# Patient Record
Sex: Female | Born: 1985 | Race: White | Hispanic: No | Marital: Married | Smoking: Never smoker
Health system: Southern US, Community
[De-identification: ages and names within clinical notes are randomized; demographics above are authoritative.]

## PROBLEM LIST (undated history)

## (undated) DIAGNOSIS — K589 Irritable bowel syndrome without diarrhea: Secondary | ICD-10-CM

## (undated) HISTORY — PX: OVARIAN CYST REMOVAL: SHX89

## (undated) HISTORY — PX: WISDOM TOOTH EXTRACTION: SHX21

---

## 2007-01-18 ENCOUNTER — Encounter (HOSPITAL_COMMUNITY): Payer: Self-pay | Admitting: Obstetrics and Gynecology

## 2007-01-18 ENCOUNTER — Ambulatory Visit (HOSPITAL_COMMUNITY): Admission: RE | Admit: 2007-01-18 | Discharge: 2007-01-18 | Payer: Self-pay | Admitting: Obstetrics and Gynecology

## 2010-06-22 NOTE — Op Note (Signed)
[name], Kim Pope              ACCOUNT NO.:  [id number]   MEDICAL RECORD NO.:  [id number]          PATIENT TYPE:  AMB   LOCATION:  SDC                           FACILITY:  [facility]   PHYSICIAN:  Zelphia Cairo, MD    DATE OF BIRTH:  04/17/1985   DATE OF PROCEDURE:  01/18/2007  DATE OF DISCHARGE:                               OPERATIVE REPORT   PREOPERATIVE DIAGNOSIS:  Right ovarian cyst, pelvic pain.   POSTOPERATIVE DIAGNOSIS:  Right ovarian cyst, pelvic pain.   PROCEDURE:  Diagnostic laparoscopy with right ovarian cystectomy.   SURGEON:  Zelphia Cairo, MD.   ANESTHESIA:  General, local.   FINDINGS:  Large simple-appearing right ovarian cyst draining clear  fluid otherwise normal appearing pelvis.   SPECIMENS:  Ovarian cyst wall to pathology.   COMPLICATIONS:  None.   CONDITION:  Extubated and stable to recovery room.   DESCRIPTION OF PROCEDURE:  [name] was taken to the operating room  where general anesthesia was found to be adequate.  She was placed in  the dorsal lithotomy position using Allen stirrups. She was prepped and  draped in sterile fashion and a catheter was used to drain her bladder  for approximately 30 mL of clear urine. A speculum was placed in the  vagina and a single-tooth tenaculum on the anterior lip of the cervix.  A Hulka clamp was placed for uterine manipulation.  The single-tooth  tenaculum and speculum were removed.  Our attention was then turned to  the abdomen.   A small infraumbilical skin incision was then made with a scalpel and  this was carried down to the underlying fascia. The fascia was grasped  with two Kocher clamps and incised using the scalpel. An 11-mm trocar  was then inserted through the peritoneum and the abdomen and pelvis were  insufflated.  A small suprapubic incision was then made with the scalpel  and a 5-mm trocar was inserted under direct visualization. The patient  was placed in Trendelenburg position and a survey of  the pelvis was  performed. The left ovary and fallopian tube appeared normal.  The  uterus appeared normal. She was noticed to have a large simple-appearing  right ovarian cyst occupying the cul-de-sac with the right fallopian  tube draped over top.   The right ovarian cyst was grasped with a blunt grasper and an incision  was made over the cyst using sissors. The cyst wall was separated with  sharp and blunt dissection using laparoscopic scissors.  The cyst wall  was ruptured during the course of manipulation and serous fluid was  noted to be draining.  The cyst wall then easily peeled from the right  ovary all the way to the base of the ovarian cyst wall.  At this time, a  definite plane between the ovary and the ovarian cyst wall could not be  detected. The base of the cyst wall was then amputated from the ovary  using the gyrus.  The specimen was removed from the pelvis and sent to  pathology.  The right ovary was then observed and found to be  hemostatic.  The pelvis was copiously irrigated. All trocars and  instruments were then removed from the  abdomen.  The fascia of the infraumbilical incision was reapproximated  with Vicryl.  The skin incisions were reapproximated with 3-0 Vicryl.  The Hulka clamp was removed from the uterus.  The patient was taken to  the recovery room in stable condition.      Zelphia Cairo, MD  Electronically Signed     GA/MEDQ  D:  01/18/2007  T:  01/19/2007  Job:  [id number]

## 2010-11-15 LAB — CBC
HCT: 38.5
MCV: 88.1
Platelets: 272
RDW: 12.3
WBC: 4.9

## 2010-11-15 LAB — PREGNANCY, URINE: Preg Test, Ur: NEGATIVE

## 2011-02-08 HISTORY — PX: BREAST BIOPSY: SHX20

## 2011-10-05 ENCOUNTER — Other Ambulatory Visit: Payer: Self-pay | Admitting: Obstetrics and Gynecology

## 2011-10-05 DIAGNOSIS — N6312 Unspecified lump in the right breast, upper inner quadrant: Secondary | ICD-10-CM

## 2011-10-06 ENCOUNTER — Ambulatory Visit
Admission: RE | Admit: 2011-10-06 | Discharge: 2011-10-06 | Disposition: A | Discharge status: Home / Self Care | Payer: 59 | Source: Ambulatory Visit | Attending: Obstetrics and Gynecology | Admitting: Obstetrics and Gynecology

## 2011-10-06 ENCOUNTER — Other Ambulatory Visit: Payer: Self-pay | Admitting: Obstetrics and Gynecology

## 2011-10-06 DIAGNOSIS — N6312 Unspecified lump in the right breast, upper inner quadrant: Secondary | ICD-10-CM

## 2013-06-13 ENCOUNTER — Encounter: Payer: Self-pay | Admitting: Internal Medicine

## 2013-08-12 ENCOUNTER — Other Ambulatory Visit (INDEPENDENT_AMBULATORY_CARE_PROVIDER_SITE_OTHER): Payer: BC Managed Care – PPO

## 2013-08-12 ENCOUNTER — Ambulatory Visit (INDEPENDENT_AMBULATORY_CARE_PROVIDER_SITE_OTHER): Payer: BC Managed Care – PPO | Admitting: Internal Medicine

## 2013-08-12 ENCOUNTER — Encounter: Payer: Self-pay | Admitting: Internal Medicine

## 2013-08-12 VITALS — BP 96/70 | HR 88 | Ht 66.5 in | Wt 141.4 lb

## 2013-08-12 DIAGNOSIS — R197 Diarrhea, unspecified: Secondary | ICD-10-CM

## 2013-08-12 DIAGNOSIS — G8929 Other chronic pain: Secondary | ICD-10-CM

## 2013-08-12 DIAGNOSIS — R634 Abnormal weight loss: Secondary | ICD-10-CM

## 2013-08-12 DIAGNOSIS — R1084 Generalized abdominal pain: Secondary | ICD-10-CM

## 2013-08-12 DIAGNOSIS — R1013 Epigastric pain: Secondary | ICD-10-CM

## 2013-08-12 DIAGNOSIS — K625 Hemorrhage of anus and rectum: Secondary | ICD-10-CM

## 2013-08-12 LAB — COMPREHENSIVE METABOLIC PANEL
ALT: 19 U/L (ref 0–35)
AST: 19 U/L (ref 0–37)
Albumin: 4.3 g/dL (ref 3.5–5.2)
Alkaline Phosphatase: 41 U/L (ref 39–117)
BUN: 14 mg/dL (ref 6–23)
CALCIUM: 9.3 mg/dL (ref 8.4–10.5)
CHLORIDE: 106 meq/L (ref 96–112)
CO2: 24 mEq/L (ref 19–32)
Creatinine, Ser: 0.8 mg/dL (ref 0.4–1.2)
GFR: 99.37 mL/min (ref >60.00)
Glucose, Bld: 100 mg/dL — ABNORMAL HIGH (ref 70–99)
POTASSIUM: 3.5 meq/L (ref 3.5–5.1)
Sodium: 139 mEq/L (ref 135–145)
Total Bilirubin: 0.4 mg/dL (ref 0.2–1.2)
Total Protein: 7.6 g/dL (ref 6.0–8.3)

## 2013-08-12 LAB — CBC WITH DIFFERENTIAL/PLATELET
BASOS PCT: 0.8 % (ref 0.0–3.0)
Basophils Absolute: 0 10*3/uL (ref 0.0–0.1)
EOS ABS: 0.2 10*3/uL (ref 0.0–0.7)
Eosinophils Relative: 3.7 % (ref 0.0–5.0)
HEMATOCRIT: 38.4 % (ref 36.0–46.0)
HEMOGLOBIN: 12.9 g/dL (ref 12.0–15.0)
LYMPHS ABS: 2.3 10*3/uL (ref 0.7–4.0)
Lymphocytes Relative: 45.1 % (ref 12.0–46.0)
MCHC: 33.6 g/dL (ref 30.0–36.0)
MCV: 87.2 fl (ref 78.0–100.0)
MONO ABS: 0.4 10*3/uL (ref 0.1–1.0)
Monocytes Relative: 8 % (ref 3.0–12.0)
NEUTROS ABS: 2.2 10*3/uL (ref 1.4–7.7)
Neutrophils Relative %: 42.4 % — ABNORMAL LOW (ref 43.0–77.0)
Platelets: 266 10*3/uL (ref 150.0–400.0)
RBC: 4.4 Mil/uL (ref 3.87–5.11)
RDW: 12.5 % (ref 11.5–15.5)
WBC: 5.1 10*3/uL (ref 4.0–10.5)

## 2013-08-12 LAB — TSH: TSH: 0.58 u[IU]/mL (ref 0.35–4.50)

## 2013-08-12 LAB — IGA: IgA: 147 mg/dL (ref 68–378)

## 2013-08-12 NOTE — Patient Instructions (Signed)
You have been scheduled for an abdominal ultrasound at [facility] (1st floor of hospital) on 08/15/2013 at 8:30am. Please arrive 15 minutes prior to your appointment for registration. Make certain not to have anything to eat or drink 6 hours prior to your appointment. Should you need to reschedule your appointment, please contact radiology at [phone]. This test typically takes about 30 minutes to perform.   Your physician has requested that you go to the basement for lab work before leaving today.  You have been scheduled for an endoscopy and colonoscopy. Please follow the written instructions given to you at your visit today. Please pick up your prep at the pharmacy within the next 1-3 days. If you use inhalers (even only as needed), please bring them with you on the day of your procedure. Your physician has requested that you go to www.startemmi.com and enter the access code given to you at your visit today. This web site gives a general overview about your procedure. However, you should still follow specific instructions given to you by our office regarding your preparation for the procedure.

## 2013-08-12 NOTE — Progress Notes (Signed)
HISTORY OF PRESENT ILLNESS:  Kim Pope is a 28 y.o. female  with no significant past medical history who presents today regarding chronic problems with abdominal pain and loose stools. She is accompanied by her mother. Patient reports that her problems started about 2 years ago after multiple family members had "a bug". She had problems with postprandial abdominal cramping and diarrhea. Symptoms seem to settle down for while only to return about 18 months ago. She describes cramping lower abdominal pain with diaphoresis associated with urgency and followed by diarrhea. Defecation does help the discomfort. Food is definitely trigger. She will not go more than one week without symptoms. Symptoms can occur several times per day. She does have constipation much less frequently. She reports for pound weight loss in the past 6 months. Occasionally symptoms that night. She also noticed blood mixed with her stool about 2 months ago. This prompted scheduling a GI appointment. Next, she mentions problems with unpredictable epigastric pain with radiation under the ribs bilaterally. Generally lasts 30 minutes. Thereafter she feels sore. This occurs about once every 2 weeks. She was told this might be acid reflux and was placed on acid reducer which did not help. There is no family history of colon cancer, inflammatory bowel disease, or celiac disease. She works full-time as a Surveyor, [employer] for [employer]. She has never smoked.  REVIEW OF SYSTEMS:  All non-GI ROS negative except for sinus and allergy, headaches, depression, muscle cramps, urinary frequency  History reviewed. No pertinent past medical history.  Past Surgical History  Procedure Laterality Date  . Ovarian cyst removal Right     Social History MARLANE HIRSCHMANN  reports that she has never smoked. She has never used smokeless tobacco. She reports that she drinks alcohol. She reports that she does not use illicit drugs.  family history includes  Diabetes in her maternal grandmother; GER disease in her maternal aunt, maternal grandfather, and mother; Irritable bowel syndrome in her father; Lymphoma in her paternal grandmother.  No Known Allergies     PHYSICAL EXAMINATION: Vital signs: BP 96/70  Pulse 88  Ht 5' 6.5" (1.689 m)  Wt 141 lb 6 oz (64.127 kg)  BMI 22.48 kg/m2  LMP 08/07/2013  Constitutional: generally well-appearing, no acute distress Psychiatric: alert and oriented x3, cooperative Eyes: extraocular movements intact, anicteric, conjunctiva pink Mouth: oral pharynx moist, no lesions Neck: supple no lymphadenopathy Cardiovascular: heart regular rate and rhythm, no murmur Lungs: clear to auscultation bilaterally Abdomen: soft, nontender, nondistended, no obvious ascites, no peritoneal signs, normal bowel sounds, no organomegaly Rectal: Deferred until colonoscopy Extremities: no lower extremity edema bilaterally Skin: no lesions on visible extremities. Tattoo on the lateral side of right foot Neuro: No focal deficits.   ASSESSMENT:  #1. Chronic postprandial abdominal discomfort with urgency and loose stools. Most likely IBS. However, given mild weight loss, episode of bleeding, and occasional nocturnal symptoms, other organic causes should be excluded #2. Separate epigastric pain 3 patient under the ribs bilaterally. Rule out gallbladder disease. Rule out ulcer disease   PLAN:  #1. Laboratories today including CBC, comprehensive metabolic panel, TSH, #2. Tissue transglutaminase antibody and serum IgA to screen for celiac disease #3. Abdominal ultrasound to evaluate upper abdominal pain #4. Upper endoscopy to evaluate abdominal pain and loose stools.The nature of the procedure, as well as the risks, benefits, and alternatives were carefully and thoroughly reviewed with the patient. Ample time for discussion and questions allowed. The patient understood, was satisfied, and agreed to proceed.The nature of  the  procedure, as well as the risks, benefits, and alternatives were carefully and thoroughly reviewed with the patient. Ample time for discussion and questions allowed. The patient understood, was satisfied, and agreed to proceed. #5. Colonoscopy to evaluate abdominal pain, loose stools, and rectal bleeding.The nature of the procedure, as well as the risks, benefits, and alternatives were carefully and thoroughly reviewed with the patient. Ample time for discussion and questions allowed. The patient understood, was satisfied, and agreed to proceed. Suprep provided with instructions. #6. Followup to be determined after the above completed

## 2013-08-13 ENCOUNTER — Encounter: Payer: Self-pay | Admitting: Internal Medicine

## 2013-08-13 LAB — TISSUE TRANSGLUTAMINASE, IGA: TISSUE TRANSGLUTAMINASE AB, IGA: 2.2 U/mL (ref <20)

## 2013-08-15 ENCOUNTER — Ambulatory Visit (HOSPITAL_COMMUNITY): Payer: BC Managed Care – PPO

## 2013-08-20 ENCOUNTER — Ambulatory Visit
Admission: RE | Admit: 2013-08-20 | Discharge: 2013-08-20 | Disposition: A | Discharge status: Home / Self Care | Payer: BC Managed Care – PPO | Source: Ambulatory Visit | Attending: Internal Medicine | Admitting: Internal Medicine

## 2013-08-20 DIAGNOSIS — K625 Hemorrhage of anus and rectum: Secondary | ICD-10-CM

## 2013-08-20 DIAGNOSIS — R1084 Generalized abdominal pain: Secondary | ICD-10-CM

## 2013-08-20 DIAGNOSIS — R634 Abnormal weight loss: Secondary | ICD-10-CM

## 2013-08-20 DIAGNOSIS — R197 Diarrhea, unspecified: Secondary | ICD-10-CM

## 2013-08-27 ENCOUNTER — Ambulatory Visit (AMBULATORY_SURGERY_CENTER): Payer: BC Managed Care – PPO | Admitting: Internal Medicine

## 2013-08-27 ENCOUNTER — Encounter: Payer: Self-pay | Admitting: Internal Medicine

## 2013-08-27 VITALS — BP 97/61 | HR 72 | Temp 98.4°F | Resp 15 | Ht 66.5 in | Wt 141.0 lb

## 2013-08-27 DIAGNOSIS — D1330 Benign neoplasm of unspecified part of small intestine: Secondary | ICD-10-CM

## 2013-08-27 DIAGNOSIS — K625 Hemorrhage of anus and rectum: Secondary | ICD-10-CM

## 2013-08-27 DIAGNOSIS — R1084 Generalized abdominal pain: Secondary | ICD-10-CM

## 2013-08-27 DIAGNOSIS — R197 Diarrhea, unspecified: Secondary | ICD-10-CM

## 2013-08-27 DIAGNOSIS — D126 Benign neoplasm of colon, unspecified: Secondary | ICD-10-CM

## 2013-08-27 MED ORDER — SODIUM CHLORIDE 0.9 % IV SOLN: 500.0000 mL | INTRAVENOUS | Status: DC

## 2013-08-27 MED ORDER — LIBRAX 5-2.5 MG PO CAPS
1.0000 | ORAL_CAPSULE | Freq: Three times a day (TID) | ORAL | Status: DC | PRN
Start: 2013-08-27 — End: 2014-11-19

## 2013-08-27 NOTE — Progress Notes (Signed)
Pt awake with spont resp, vss alert and oriented, pleased with MAC, report to RN

## 2013-08-27 NOTE — Progress Notes (Signed)
Called to room to assist during endoscopic procedure.  Patient ID and intended procedure confirmed with present staff. Received instructions for my participation in the procedure from the performing physician.  

## 2013-08-27 NOTE — Op Note (Signed)
[place]  [address] [place], [address]   COLONOSCOPY PROCEDURE REPORT  PATIENT: Kim, Pope  MR#: [id number] BIRTHDATE: 1985/03/21 , 27  yrs. old GENDER: Female ENDOSCOPIST: Eustace Quail, MD REFERRED BY:.  Self / Office PROCEDURE DATE:  08/27/2013 PROCEDURE:   Colonoscopy with biopsies First Screening Colonoscopy - Avg.  risk and is 50 yrs.  old or older - No.  Prior Negative Screening - Now for repeat screening. N/A  History of Adenoma - Now for follow-up colonoscopy & has been > or = to 3 yrs.  N/A  Polyps Removed Today? No.  Recommend repeat exam, <10 yrs? No. ASA CLASS:   Class I INDICATIONS:Chronic diarrhea, Abdominal pain, and rectal bleeding.  MEDICATIONS: MAC sedation, administered by CRNA and propofol (Diprivan) 250mg  IV DESCRIPTION OF PROCEDURE:   After the risks benefits and alternatives of the procedure were thoroughly explained, informed consent was obtained.  A digital rectal exam revealed no abnormalities of the rectum.   The LB [id number]  endoscope was introduced through the anus and advanced to the cecum, which was identified by both the appendix and ileocecal valve. No adverse events experienced.   The quality of the prep was excellent using Suprep  The instrument was then slowly withdrawn as the colon was fully examined.  COLON FINDINGS: The mucosa appeared normal in the terminal ileum. A normal appearing cecum, ileocecal valve, and appendiceal orifice were identified.  The ascending, hepatic flexure, transverse, splenic flexure, descending, sigmoid colon and rectum appeared unremarkable.  No polyps or cancers. No inflammation.Random colon biopsies taken.  Retroflexed views revealed internal hemorrhoids. The time to cecum=3 min 02 sec.  Withdrawal time=7 min 31 sec.  The scope was withdrawn and the procedure completed. COMPLICATIONS: There were no complications.  ENDOSCOPIC IMPRESSION: 1.   Normal mucosa in the  terminal ileum 2.   Normal colon 3.  Suspect IBS  RECOMMENDATIONS: 1.  Await biopsy results 2.  Upper endoscopy today (see report)   eSigned:  Eustace Quail, MD 08/27/2013 3:50 PM   cc: The Patient and Marylynn Pearson MD

## 2013-08-27 NOTE — Op Note (Signed)
[place]  [address]. [place], [address]   ENDOSCOPY PROCEDURE REPORT  PATIENT: Kim Pope, Kim Pope  MR#: [id number] BIRTHDATE: 1985-11-18 , 27  yrs. old GENDER: Female ENDOSCOPIST: Eustace Quail, MD REFERRED BY:  .  Self / Office PROCEDURE DATE:  08/27/2013 PROCEDURE:  EGD w/ biopsy ASA CLASS:     Class I INDICATIONS:  abdominal pain.   Unexplained diarrhea. MEDICATIONS: MAC sedation, administered by CRNA and propofol (Diprivan) 200mg  IV TOPICAL ANESTHETIC: none  DESCRIPTION OF PROCEDURE: After the risks benefits and alternatives of the procedure were thoroughly explained, informed consent was obtained.  The LB [id number] endoscope was introduced through the mouth and advanced to the second portion of the duodenum. Without limitations.  The instrument was slowly withdrawn as the mucosa was fully examined.    EXAM:The upper, middle and distal third of the esophagus were carefully inspected and no abnormalities were noted.  The z-line was well seen at the GEJ.  The endoscope was pushed into the fundus which was normal including a retroflexed view.  The antrum, gastric body, first and second part of the duodenum were unremarkable. Multiple duodenal biopsies taken.  Retroflexed views revealed no abnormalities.     The scope was then withdrawn from the patient and the procedure completed.  COMPLICATIONS: There were no complications. ENDOSCOPIC IMPRESSION: 1. Normal EGD 2. Suspect IBS (irritable bowel syndrome)  RECOMMENDATIONS: 1.  Await biopsy results 2.  Prescribe Librax (generic); #100; one by mouth before meals as needed 2.  Call office next 2-3 days to schedule an office appointment with Dr. Henrene Pastor for 4-6 weeks  REPEAT EXAM:  eSigned:  Eustace Quail, MD 08/27/2013 4:03 PM   CC:The Patient and Marylynn Pearson MD

## 2013-08-27 NOTE — Patient Instructions (Signed)
YOU HAD AN ENDOSCOPIC PROCEDURE TODAY AT THE [facility]: Refer to the procedure report that was given to you for any specific questions about what was found during the examination.  If the procedure report does not answer your questions, please call your gastroenterologist to clarify.  If you requested that your care partner not be given the details of your procedure findings, then the procedure report has been included in a sealed envelope for you to review at your convenience later.  YOU SHOULD EXPECT: Some feelings of bloating in the abdomen. Passage of more gas than usual.  Walking can help get rid of the air that was put into your GI tract during the procedure and reduce the bloating. If you had a lower endoscopy (such as a colonoscopy or flexible sigmoidoscopy) you may notice spotting of blood in your stool or on the toilet paper. If you underwent a bowel prep for your procedure, then you may not have a normal bowel movement for a few days.  DIET: Your first meal following the procedure should be a light meal and then it is ok to progress to your normal diet.  A half-sandwich or bowl of soup is an example of a good first meal.  Heavy or fried foods are harder to digest and may make you feel nauseous or bloated.  Likewise meals heavy in dairy and vegetables can cause extra gas to form and this can also increase the bloating.  Drink plenty of fluids but you should avoid alcoholic beverages for 24 hours.  ACTIVITY: Your care partner should take you home directly after the procedure.  You should plan to take it easy, moving slowly for the rest of the day.  You can resume normal activity the day after the procedure however you should NOT DRIVE or use heavy machinery for 24 hours (because of the sedation medicines used during the test).    SYMPTOMS TO REPORT IMMEDIATELY: A gastroenterologist can be reached at any hour.  During normal business hours, 8:30 AM to 5:00 PM Monday through Friday,  call [phone].  After hours and on weekends, please call the GI answering service at [phone] who will take a message and have the physician on call contact you.   Following lower endoscopy (colonoscopy or flexible sigmoidoscopy):  Excessive amounts of blood in the stool  Significant tenderness or worsening of abdominal pains  Swelling of the abdomen that is new, acute  Fever of 100F or higher  Following upper endoscopy (EGD)  Vomiting of blood or coffee ground material  New chest pain or pain under the shoulder blades  Painful or persistently difficult swallowing  New shortness of breath  Fever of 100F or higher  Black, tarry-looking stools  FOLLOW UP: If any biopsies were taken you will be contacted by phone or by letter within the next 1-3 weeks.  Call your gastroenterologist if you have not heard about the biopsies in 3 weeks.  Our staff will call the home number listed on your records the next business day following your procedure to check on you and address any questions or concerns that you may have at that time regarding the information given to you following your procedure. This is a courtesy call and so if there is no answer at the home number and we have not heard from you through the emergency physician on call, we will assume that you have returned to your regular daily activities without incident.  SIGNATURES/CONFIDENTIALITY: You and/or your care   partner have signed paperwork which will be entered into your electronic medical record.  These signatures attest to the fact that that the information above on your After Visit Summary has been reviewed and is understood.  Full responsibility of the confidentiality of this discharge information lies with you and/or your care-partner.  Recommendations Await biopsy results Librax prescription Call and schedule appointment to be seen by Dr. Henrene Pastor for 4-6 weeks.

## 2013-08-28 ENCOUNTER — Telehealth: Payer: Self-pay | Admitting: *Deleted

## 2013-08-28 NOTE — Telephone Encounter (Signed)
No answer, left message to call if questions or concerns. 

## 2013-09-02 ENCOUNTER — Encounter: Payer: Self-pay | Admitting: Internal Medicine

## 2013-10-09 ENCOUNTER — Ambulatory Visit: Payer: BC Managed Care – PPO | Admitting: Internal Medicine

## 2014-03-27 ENCOUNTER — Other Ambulatory Visit: Payer: Self-pay | Admitting: Obstetrics and Gynecology

## 2014-03-31 LAB — CYTOLOGY - PAP

## 2014-07-01 ENCOUNTER — Other Ambulatory Visit: Payer: Self-pay | Admitting: Obstetrics and Gynecology

## 2014-07-02 LAB — CYTOLOGY - PAP

## 2014-10-23 ENCOUNTER — Inpatient Hospital Stay (HOSPITAL_COMMUNITY): Admission: AD | Admit: 2014-10-23 | Payer: Self-pay | Source: Ambulatory Visit | Admitting: Obstetrics and Gynecology

## 2014-11-19 ENCOUNTER — Inpatient Hospital Stay (HOSPITAL_COMMUNITY)
Admission: RE | Admit: 2014-11-19 | Discharge: 2014-11-21 | Disposition: A | Discharge status: Home / Self Care | Payer: 59 | Source: Ambulatory Visit | Attending: Obstetrics and Gynecology | Admitting: Obstetrics and Gynecology

## 2014-11-19 ENCOUNTER — Encounter (HOSPITAL_COMMUNITY): Payer: Self-pay | Admitting: *Deleted

## 2014-11-19 DIAGNOSIS — Z3A30 30 weeks gestation of pregnancy: Secondary | ICD-10-CM | POA: Diagnosis not present

## 2014-11-19 DIAGNOSIS — O30003 Twin pregnancy, unspecified number of placenta and unspecified number of amniotic sacs, third trimester: Secondary | ICD-10-CM | POA: Insufficient documentation

## 2014-11-19 DIAGNOSIS — O321XX1 Maternal care for breech presentation, fetus 1: Secondary | ICD-10-CM | POA: Insufficient documentation

## 2014-11-19 DIAGNOSIS — O4700 False labor before 37 completed weeks of gestation, unspecified trimester: Secondary | ICD-10-CM

## 2014-11-19 HISTORY — DX: Irritable bowel syndrome, unspecified: K58.9

## 2014-11-19 LAB — CBC
HCT: 33.5 % — ABNORMAL LOW (ref 36.0–46.0)
Hemoglobin: 10.9 g/dL — ABNORMAL LOW (ref 12.0–15.0)
MCH: 28.9 pg (ref 26.0–34.0)
MCHC: 32.5 g/dL (ref 30.0–36.0)
MCV: 88.9 fL (ref 78.0–100.0)
PLATELETS: 256 10*3/uL (ref 150–400)
RBC: 3.77 MIL/uL — ABNORMAL LOW (ref 3.87–5.11)
RDW: 13.6 % (ref 11.5–15.5)
WBC: 14.9 10*3/uL — ABNORMAL HIGH (ref 4.0–10.5)

## 2014-11-19 MED ORDER — DOCUSATE SODIUM 100 MG PO CAPS
100.0000 mg | ORAL_CAPSULE | Freq: Every day | ORAL | Status: DC
  Administered 2014-11-20 – 2014-11-21 (×2): 100 mg via ORAL
  Filled 2014-11-19 (×2): qty 1

## 2014-11-19 MED ORDER — PRENATAL MULTIVITAMIN CH
1.0000 | ORAL_TABLET | Freq: Every day | ORAL | Status: DC
  Administered 2014-11-20: 1 via ORAL
  Filled 2014-11-19: qty 1

## 2014-11-19 MED ORDER — NIFEDIPINE 10 MG PO CAPS
10.0000 mg | ORAL_CAPSULE | ORAL | Status: DC | PRN
  Administered 2014-11-19 – 2014-11-20 (×2): 10 mg via ORAL
  Filled 2014-11-19 (×2): qty 1

## 2014-11-19 MED ORDER — NIFEDIPINE 10 MG PO CAPS
20.0000 mg | ORAL_CAPSULE | Freq: Once | ORAL | Status: AC
  Administered 2014-11-19: 20 mg via ORAL
  Filled 2014-11-19: qty 2

## 2014-11-19 MED ORDER — BETAMETHASONE SOD PHOS & ACET 6 (3-3) MG/ML IJ SUSP
12.0000 mg | INTRAMUSCULAR | Status: AC
  Administered 2014-11-19 – 2014-11-20 (×2): 12 mg via INTRAMUSCULAR
  Filled 2014-11-19 (×2): qty 2

## 2014-11-19 MED ORDER — SODIUM CHLORIDE 0.9 % IV SOLN
INTRAVENOUS | Status: DC
  Administered 2014-11-19 – 2014-11-20 (×3): via INTRAVENOUS

## 2014-11-19 MED ORDER — CALCIUM CARBONATE ANTACID 500 MG PO CHEW: 2.0000 | CHEWABLE_TABLET | ORAL | Status: DC | PRN

## 2014-11-19 MED ORDER — ZOLPIDEM TARTRATE 5 MG PO TABS
5.0000 mg | ORAL_TABLET | Freq: Every evening | ORAL | Status: DC | PRN
  Administered 2014-11-20: 5 mg via ORAL
  Filled 2014-11-19: qty 1

## 2014-11-19 MED ORDER — ACETAMINOPHEN 325 MG PO TABS: 650.0000 mg | ORAL_TABLET | ORAL | Status: DC | PRN

## 2014-11-19 NOTE — H&P (Addendum)
Kim Pope is a 29 y.o. female G1 @ 30+6 wks presenting for preterm contractions.  No vb or lof.  Pt was noted to have shortened cervix 2 weeks ago and placed on outpt bedrest.  Doing well until 2 nights ago when she began to have increase in contractions.  Pt given one dose of PO procardia in MAU and noted decrease in frequency and intensity of ctx.  History OB History    Gravida Para Term Preterm AB TAB SAB Ectopic Multiple Living   1              Past Medical History  Diagnosis Date  . IBS (irritable bowel syndrome)    Past Surgical History  Procedure Laterality Date  . Ovarian cyst removal Right   . Wisdom tooth extraction     Family History: family history includes Diabetes in her maternal grandmother; GER disease in her maternal aunt, maternal grandfather, and mother; Irritable bowel syndrome in her father; Lymphoma in her paternal grandmother. Social History:  reports that she has never smoked. She has never used smokeless tobacco. She reports that she does not drink alcohol or use illicit drugs.   Prenatal Transfer Tool  Maternal Diabetes: No Genetic Screening: Normal Maternal Ultrasounds/Referrals: Normal Fetal Ultrasounds or other Referrals:  None Maternal Substance Abuse:  No Significant Maternal Medications:  None Significant Maternal Lab Results:  None Other Comments:  None  ROS    Blood pressure 129/70, pulse 108, temperature 97.9 F (36.6 C), temperature source Oral, resp. rate 18. Exam Physical Exam  Gen - NAD Abd - gravid, NT Ext - NT, no edema Cvx deferred - cvx length 1.5cm in office  FT/90/-2 on last cervical exam Prenatal labs: ABO, Rh:   Antibody:   Rubella:   RPR:    HBsAg:    HIV:    GBS:     [place] in office 11/19/14:  Breech/vtx:  1666gm/1723gm.  AFV wnl.  cvx 1.57 w/ funneling  Assessment/Plan:  PTL, twins Admit for obs Bedrest BMZ If ctx persist - will start magnesium for steroid time Plan of care d/w pt and  family   Kim Pope 11/19/2014, 6:03 PM

## 2014-11-19 NOTE — MAU Note (Signed)
Pt sent to MAU from MD office for uc's, SVE - cervix is thinning.  Denies bleeding or LOF.  Pt states she has lower abd cramping & back pain.

## 2014-11-19 NOTE — MAU Provider Note (Signed)
Chief Complaint:  Contractions   First Provider Initiated Contact with Patient 11/19/14 1642      HPI: Kim Pope is a 29 y.o. G1P0 at [redacted]w[redacted]d with twins pregnancy who presents to maternity admissions sent from the office related to contractions and shortened cervix.  She reports her cervix is dilated a little, fingertip, and is shortened on exam.  Today, she had routine OB visit and reported increase in contractions over last 2 days.  [place] in office revealed cervix 1.5 cm in length. Contractions are intermittent, 6-10/hour, cramping/tightening.  She has not taken any medications for pain and increasing PO fluids has not improved the contractions.  They are staying the same today, and not worsening.   She was sent to MAU for further evaluation of contractions. She reports good fetal movement, denies LOF, vaginal bleeding, vaginal itching/burning, urinary symptoms, h/a, dizziness, n/v, or fever/chills.    HPI  Past Medical History: Past Medical History  Diagnosis Date  . IBS (irritable bowel syndrome)     Past obstetric history: OB History  Gravida Para Term Preterm AB SAB TAB Ectopic Multiple Living  1             # Outcome Date GA Lbr Len/2nd Weight Sex Delivery Anes PTL Lv  1 Current               Past Surgical History: Past Surgical History  Procedure Laterality Date  . Ovarian cyst removal Right   . Wisdom tooth extraction      Family History: Family History  Problem Relation Age of Onset  . Lymphoma Paternal Grandmother   . Diabetes Maternal Grandmother   . Irritable bowel syndrome Father   . GER disease Mother   . GER disease Maternal Aunt   . GER disease Maternal Grandfather     Social History: Social History  Substance Use Topics  . Smoking status: Never Smoker   . Smokeless tobacco: Never Used  . Alcohol Use: No    Allergies:  Allergies  Allergen Reactions  . Shellfish Allergy Itching    Meds:  Prescriptions prior to admission  Medication Sig  Dispense Refill Last Dose  . diphenhydrAMINE (BENADRYL) 25 mg capsule Take 25 mg by mouth every 6 (six) hours as needed for allergies.   Past Month at Unknown time  . prenatal vitamin w/FE, FA (NATACHEW) 29-1 MG CHEW chewable tablet Chew 2 tablets by mouth daily at 12 noon.   11/18/2014 at Unknown time  . [DISCONTINUED] LIBRAX 5-2.5 MG per capsule Take 1 capsule by mouth 3 (three) times daily with meals as needed (One by mouth before meals as needed). (Patient not taking: Reported on 11/19/2014) 100 capsule 0 Completed Course at Unknown time    ROS:  Review of Systems  Constitutional: Negative for fever, chills and fatigue.  HENT: Negative for sinus pressure.   Eyes: Negative for photophobia.  Respiratory: Negative for shortness of breath.   Cardiovascular: Negative for chest pain.  Gastrointestinal: Negative for nausea, vomiting, diarrhea and constipation.  Genitourinary: Negative for dysuria, frequency, flank pain, vaginal bleeding, vaginal discharge, difficulty urinating, vaginal pain and pelvic pain.  Musculoskeletal: Negative for neck pain.  Neurological: Negative for dizziness, weakness and headaches.  Psychiatric/Behavioral: Negative.      I have reviewed patient's Past Medical Hx, Surgical Hx, Family Hx, Social Hx, medications and allergies.   Physical Exam   Patient Vitals for the past 24 hrs:  BP Temp Temp src Pulse Resp  11/19/14 1720 129/70 mmHg - -  108 -  11/19/14 1623 111/71 mmHg 97.9 F (36.6 C) Oral 108 18   Constitutional: Well-developed, well-nourished female in no acute distress.  Cardiovascular: normal rate Respiratory: normal effort GI: Abd soft, non-tender, gravid appropriate for gestational age.  MS: Extremities nontender, no edema, normal ROM Neurologic: Alert and oriented x 4.  GU: Neg CVAT.  PELVIC EXAM: Cervix pink, visually closed, without lesion, scant white creamy discharge, vaginal walls and external genitalia normal Bimanual exam: Cervix  0/long/high, firm, anterior, neg CMT, uterus nontender, nonenlarged, adnexa without tenderness, enlargement, or mass     FHT Baby A:  Baseline 140 , moderate variability, accelerations present, no decelerations FHT Baby B:  Baseline 140 , moderate variability, accelerations present, no decelerations Contractions: q 5-8 mins, mild to palpation   Labs: No results found for this or any previous visit (from the past 24 hour(s)).    Imaging:  No results found.  MAU Course/MDM: I have ordered labs and reviewed results.  Consult Dr Julien Girt, reviewed FHR tracing and assessment.  Treatments in MAU included Procardia 20 mg x 1 PO dose.    Assessment: 1. Threatened preterm labor, antepartum     Plan: Dr Julien Girt to bedside Admit for observation    Medication List    ASK your doctor about these medications        diphenhydrAMINE 25 mg capsule  Commonly known as:  BENADRYL  Take 25 mg by mouth every 6 (six) hours as needed for allergies.     prenatal vitamin w/FE, FA 29-1 MG Chew chewable tablet  Chew 2 tablets by mouth daily at 12 noon.        Fatima Blank Certified Nurse-Midwife 11/19/2014 6:13 PM

## 2014-11-20 MED ORDER — NIFEDIPINE 10 MG PO CAPS: 10.0000 mg | ORAL_CAPSULE | ORAL | Status: DC | PRN

## 2014-11-20 MED ORDER — NIFEDIPINE 10 MG PO CAPS
10.0000 mg | ORAL_CAPSULE | ORAL | Status: DC
Start: 2014-11-20 — End: 2014-11-21
  Administered 2014-11-20 – 2014-11-21 (×7): 10 mg via ORAL
  Filled 2014-11-20 (×7): qty 1

## 2014-11-20 NOTE — Progress Notes (Signed)
Left message on Grewal's cell regarding new rash noted shortly after putting on SCDs. SCDs currently off and 30 minutes after removing, pt reports relief.

## 2014-11-20 NOTE — Progress Notes (Signed)
S:  Patient is feeling better today but still feeling contractions.    O:  BP 106/52 mmHg  Pulse 102  Temp(Src) 97.8 F (36.6 C) (Oral)  Resp 18  SpO2 99% Tocometer - contractions - mild every 4 to 6   Results for orders placed or performed during the hospital encounter of 11/19/14 (from the past 24 hour(s))  Type and screen [place]     Status: None (Preliminary result)   Collection Time: 11/19/14  6:10 PM  Result Value Ref Range   ABO/RH(D) O NEG    Antibody Screen POS    Sample Expiration 11/22/2014    DAT, IgG NEG    Antibody Identification PASSIVELY ACQUIRED ANTI-D    Unit Number [id number]    Blood Component Type RED CELLS,LR    Unit division 00    Status of Unit ALLOCATED    Transfusion Status OK TO TRANSFUSE    Crossmatch Result COMPATIBLE    Unit Number [id number]    Blood Component Type RED CELLS,LR    Unit division 00    Status of Unit ALLOCATED    Transfusion Status OK TO TRANSFUSE    Crossmatch Result COMPATIBLE   CBC     Status: Abnormal   Collection Time: 11/19/14  6:25 PM  Result Value Ref Range   WBC 14.9 (H) 4.0 - 10.5 K/uL   RBC 3.77 (L) 3.87 - 5.11 MIL/uL   Hemoglobin 10.9 (L) 12.0 - 15.0 g/dL   HCT 33.5 (L) 36.0 - 46.0 %   MCV 88.9 78.0 - 100.0 fL   MCH 28.9 26.0 - 34.0 pg   MCHC 32.5 30.0 - 36.0 g/dL   RDW 13.6 11.5 - 15.5 %   Platelets 256 150 - 400 K/uL   IMPRESSION: Twin IUP at 31 weeks Preterm contractions  PLAN: Complete steroid series Start procardia every 4 to 6 hours Possible discharge tomorrow

## 2014-11-20 NOTE — Progress Notes (Signed)
Pt states she has been able to sleep over the last hour or so.    Pt does report some intermittent  "throbbing" in vagina sometimes if she sits up, but not while lying down,  Pt states she is currently comfortable.

## 2014-11-21 LAB — CULTURE, BETA STREP (GROUP B ONLY)

## 2014-11-21 MED ORDER — NIFEDIPINE 10 MG PO CAPS: 10.0000 mg | ORAL_CAPSULE | ORAL | Status: DC | PRN

## 2014-11-21 NOTE — Discharge Instructions (Signed)
Preterm Labor Information Preterm labor is when labor starts at less than 37 weeks of pregnancy. The normal length of a pregnancy is 39 to 41 weeks. CAUSES Often, there is no identifiable underlying cause as to why a woman goes into preterm labor. One of the most common known causes of preterm labor is infection. Infections of the uterus, cervix, vagina, amniotic sac, bladder, kidney, or even the lungs (pneumonia) can cause labor to start. Other suspected causes of preterm labor include:   Urogenital infections, such as yeast infections and bacterial vaginosis.   Uterine abnormalities (uterine shape, uterine septum, fibroids, or bleeding from the placenta).   A cervix that has been operated on (it may fail to stay closed).   Malformations in the fetus.   Multiple gestations (twins, triplets, and so on).   Breakage of the amniotic sac.  RISK FACTORS 1. Having a previous history of preterm labor.  2. Having premature rupture of membranes (PROM).  3. Having a placenta that covers the opening of the cervix (placenta previa).  4. Having a placenta that separates from the uterus (placental abruption).  5. Having a cervix that is too weak to hold the fetus in the uterus (incompetent cervix).  6. Having too much fluid in the amniotic sac (polyhydramnios).  7. Taking illegal drugs or smoking while pregnant.  8. Not gaining enough weight while pregnant.  55. Being younger than 74 and older than 27 years old.  10. Having a low socioeconomic status.  21. Being African American. SYMPTOMS Signs and symptoms of preterm labor include:   Menstrual-like cramps, abdominal pain, or back pain.  Uterine contractions that are regular, as frequent as six in an hour, regardless of their intensity (may be mild or painful).  Contractions that start on the top of the uterus and spread down to the lower abdomen and back.   A sense of increased pelvic pressure.   A watery or bloody mucus  discharge that comes from the vagina.  TREATMENT Depending on the length of the pregnancy and other circumstances, your health care provider may suggest bed rest. If necessary, there are medicines that can be given to stop contractions and to mature the fetal lungs. If labor happens before 34 weeks of pregnancy, a prolonged hospital stay may be recommended. Treatment depends on the condition of both you and the fetus.  WHAT SHOULD YOU DO IF YOU THINK YOU ARE IN PRETERM LABOR? Call your health care provider right away. You will need to go to the hospital to get checked immediately. HOW CAN YOU PREVENT PRETERM LABOR IN FUTURE PREGNANCIES? You should:   Stop smoking if you smoke.  Maintain healthy weight gain and avoid chemicals and drugs that are not necessary.  Be watchful for any type of infection.  Inform your health care provider if you have a known history of preterm labor.   This information is not intended to replace advice given to you by your health care provider. Make sure you discuss any questions you have with your health care provider.   Document Released: 04/16/2003 Document Revised: 09/26/2012 Document Reviewed: 02/27/2012 Elsevier Interactive Patient Education 2016 [employer]. Fetal Movement Counts Patient Name: __________________________________________________ Patient Due Date: ____________________ Performing a fetal movement count is highly recommended in high-risk pregnancies, but it is good for every pregnant woman to do. Your health care provider may ask you to start counting fetal movements at 28 weeks of the pregnancy. Fetal movements often increase:  After eating a full meal.  After physical activity.  After eating or drinking something sweet or cold.  At rest. Pay attention to when you feel the baby is most active. This will help you notice a pattern of your baby's sleep and wake cycles and what factors contribute to an increase in fetal movement. It is  important to perform a fetal movement count at the same time each day when your baby is normally most active.  HOW TO COUNT FETAL MOVEMENTS 12. Find a quiet and comfortable area to sit or lie down on your left side. Lying on your left side provides the best blood and oxygen circulation to your baby. 13. Write down the day and time on a sheet of paper or in a journal. 14. Start counting kicks, flutters, swishes, rolls, or jabs in a 2-hour period. You should feel at least 10 movements within 2 hours. 15. If you do not feel 10 movements in 2 hours, wait 2-3 hours and count again. Look for a change in the pattern or not enough counts in 2 hours. SEEK MEDICAL CARE IF:  You feel less than 10 counts in 2 hours, tried twice.  There is no movement in over an hour.  The pattern is changing or taking longer each day to reach 10 counts in 2 hours.  You feel the baby is not moving as he or she usually does. Date: ____________ Movements: ____________ Start time: ____________ Kim Pope time: ____________  Date: ____________ Movements: ____________ Start time: ____________ Kim Pope time: ____________ Date: ____________ Movements: ____________ Start time: ____________ Kim Pope time: ____________ Date: ____________ Movements: ____________ Start time: ____________ Kim Pope time: ____________ Date: ____________ Movements: ____________ Start time: ____________ Kim Pope time: ____________ Date: ____________ Movements: ____________ Start time: ____________ Kim Pope time: ____________ Date: ____________ Movements: ____________ Start time: ____________ Kim Pope time: ____________ Date: ____________ Movements: ____________ Start time: ____________ Kim Pope time: ____________  Date: ____________ Movements: ____________ Start time: ____________ Kim Pope time: ____________ Date: ____________ Movements: ____________ Start time: ____________ Kim Pope time: ____________ Date: ____________ Movements: ____________ Start time: ____________ Kim Pope  time: ____________ Date: ____________ Movements: ____________ Start time: ____________ Kim Pope time: ____________ Date: ____________ Movements: ____________ Start time: ____________ Kim Pope time: ____________ Date: ____________ Movements: ____________ Start time: ____________ Kim Pope time: ____________ Date: ____________ Movements: ____________ Start time: ____________ Kim Pope time: ____________  Date: ____________ Movements: ____________ Start time: ____________ Kim Pope time: ____________ Date: ____________ Movements: ____________ Start time: ____________ Kim Pope time: ____________ Date: ____________ Movements: ____________ Start time: ____________ Kim Pope time: ____________ Date: ____________ Movements: ____________ Start time: ____________ Kim Pope time: ____________ Date: ____________ Movements: ____________ Start time: ____________ Kim Pope time: ____________ Date: ____________ Movements: ____________ Start time: ____________ Kim Pope time: ____________ Date: ____________ Movements: ____________ Start time: ____________ Kim Pope time: ____________  Date: ____________ Movements: ____________ Start time: ____________ Kim Pope time: ____________ Date: ____________ Movements: ____________ Start time: ____________ Kim Pope time: ____________ Date: ____________ Movements: ____________ Start time: ____________ Kim Pope time: ____________ Date: ____________ Movements: ____________ Start time: ____________ Kim Pope time: ____________ Date: ____________ Movements: ____________ Start time: ____________ Kim Pope time: ____________ Date: ____________ Movements: ____________ Start time: ____________ Kim Pope time: ____________ Date: ____________ Movements: ____________ Start time: ____________ Kim Pope time: ____________  Date: ____________ Movements: ____________ Start time: ____________ Kim Pope time: ____________ Date: ____________ Movements: ____________ Start time: ____________ Kim Pope time: ____________ Date: ____________  Movements: ____________ Start time: ____________ Kim Pope time: ____________ Date: ____________ Movements: ____________ Start time: ____________ Kim Pope time: ____________ Date: ____________ Movements: ____________ Start time: ____________ Kim Pope time: ____________ Date: ____________ Movements: ____________ Start time: ____________ Kim Pope time: ____________ Date: ____________ Movements: ____________ Start time: ____________  Finish time: ____________  Date: ____________ Movements: ____________ Start time: ____________ Kim Pope time: ____________ Date: ____________ Movements: ____________ Start time: ____________ Kim Pope time: ____________ Date: ____________ Movements: ____________ Start time: ____________ Kim Pope time: ____________ Date: ____________ Movements: ____________ Start time: ____________ Kim Pope time: ____________ Date: ____________ Movements: ____________ Start time: ____________ Kim Pope time: ____________ Date: ____________ Movements: ____________ Start time: ____________ Kim Pope time: ____________ Date: ____________ Movements: ____________ Start time: ____________ Kim Pope time: ____________  Date: ____________ Movements: ____________ Start time: ____________ Kim Pope time: ____________ Date: ____________ Movements: ____________ Start time: ____________ Kim Pope time: ____________ Date: ____________ Movements: ____________ Start time: ____________ Kim Pope time: ____________ Date: ____________ Movements: ____________ Start time: ____________ Kim Pope time: ____________ Date: ____________ Movements: ____________ Start time: ____________ Kim Pope time: ____________ Date: ____________ Movements: ____________ Start time: ____________ Kim Pope time: ____________ Date: ____________ Movements: ____________ Start time: ____________ Kim Pope time: ____________  Date: ____________ Movements: ____________ Start time: ____________ Kim Pope time: ____________ Date: ____________ Movements: ____________ Start time:  ____________ Kim Pope time: ____________ Date: ____________ Movements: ____________ Start time: ____________ Kim Pope time: ____________ Date: ____________ Movements: ____________ Start time: ____________ Kim Pope time: ____________ Date: ____________ Movements: ____________ Start time: ____________ Kim Pope time: ____________ Date: ____________ Movements: ____________ Start time: ____________ Kim Pope time: ____________   This information is not intended to replace advice given to you by your health care provider. Make sure you discuss any questions you have with your health care provider.   Document Released: 02/23/2006 Document Revised: 02/14/2014 Document Reviewed: 11/21/2011 Elsevier Interactive Patient Education [employer].

## 2014-11-21 NOTE — Discharge Summary (Signed)
OB Discharge Summary     Patient Name: Kim Pope DOB: April 03, 1985 MRN: [id number]  Date of admission: 11/19/2014 Delivering MD: This patient has no babies on file.  Date of discharge: 11/21/2014  Admitting diagnosis: twins 30w monitor ctx Intrauterine pregnancy: [redacted]w[redacted]d     Secondary diagnosis: Preterm labor     Discharge diagnosis: Twins preterm labor                                                                                                Post partum procedures:na  Augmentation: na  Complications: None  Hospital course:  ADmitted for PTL sxs and cx change with Twins.  Responded to rest and procardia.  Received BMZ series.  Physical exam  Filed Vitals:   11/21/14 0447 11/21/14 0453 11/21/14 0543 11/21/14 0813  BP: 100/53 100/53  112/54  Pulse:  101    Temp:  97.5 F (36.4 C)    TempSrc:  Oral    Resp:      Height:   5\' 7"  (1.702 m)   Weight:   184 lb (83.462 kg)   SpO2:      FHR reactive x 2 Ctxs 2-6x/h mild General: alert Lochia: appropriate Uterine Fundus: firm Incision: N/A DVT Evaluation: No evidence of DVT seen on physical exam.  Cervix:  Closed/70/-2  Breech presenting twin Labs: Lab Results  Component Value Date   WBC 14.9* 11/19/2014   HGB 10.9* 11/19/2014   HCT 33.5* 11/19/2014   MCV 88.9 11/19/2014   PLT 256 11/19/2014   CMP Latest Ref Rng 08/12/2013  Glucose 70 - 99 mg/dL 100(H)  BUN 6 - 23 mg/dL 14  Creatinine 0.4 - 1.2 mg/dL 0.8  Sodium 135 - 145 mEq/L 139  Potassium 3.5 - 5.1 mEq/L 3.5  Chloride 96 - 112 mEq/L 106  CO2 19 - 32 mEq/L 24  Calcium 8.4 - 10.5 mg/dL 9.3  Total Protein 6.0 - 8.3 g/dL 7.6  Total Bilirubin 0.2 - 1.2 mg/dL 0.4  Alkaline Phos 39 - 117 U/L 41  AST 0 - 37 U/L 19  ALT 0 - 35 U/L 19    Discharge instruction: per After Visit Summary and "Baby and Me Booklet".  Medications:  Current facility-administered medications:  .  0.9 %  sodium chloride infusion, , Intravenous, Continuous, Marylynn Pearson,  MD, Last Rate: 75 mL/hr at 11/20/14 2041 .  acetaminophen (TYLENOL) tablet 650 mg, 650 mg, Oral, Q4H PRN, Marylynn Pearson, MD .  calcium carbonate (TUMS - dosed in mg elemental calcium) chewable tablet 400 mg of elemental calcium, 2 tablet, Oral, Q4H PRN, Marylynn Pearson, MD .  docusate sodium (COLACE) capsule 100 mg, 100 mg, Oral, Daily, Marylynn Pearson, MD, 100 mg at 11/21/14 0814 .  NIFEdipine (PROCARDIA) capsule 10 mg, 10 mg, Oral, Q4H, Marylynn Pearson, MD, 10 mg at 11/21/14 0813 .  NIFEdipine (PROCARDIA) capsule 10 mg, 10 mg, Oral, Q4H PRN, Dian Queen, MD .  prenatal multivitamin tablet 1 tablet, 1 tablet, Oral, Q1200, Marylynn Pearson, MD, 1 tablet at 11/20/14 1201 .  zolpidem (AMBIEN) tablet 5 mg, 5 mg, Oral, QHS  PRN, Marylynn Pearson, MD, 5 mg at 11/20/14 2303  Diet: routine diet  Activity  Modified rest with PTL warnings.   Outpatient follow up:4 days as scheduled  Postpartum contraception: na  Newborn Data: Still pregnant This patient has no babies on file. Baby Feeding: na Disposition:   11/21/2014 Luz Lex, MD

## 2014-11-21 NOTE — Progress Notes (Signed)
Instructed by Dr. Corinna Capra not to do a.m. fetal monitoring this morning.

## 2014-11-23 LAB — TYPE AND SCREEN
ABO/RH(D): O NEG
Antibody Screen: POSITIVE
DAT, IGG: NEGATIVE
Unit division: 0
Unit division: 0

## 2014-12-03 ENCOUNTER — Ambulatory Visit (INDEPENDENT_AMBULATORY_CARE_PROVIDER_SITE_OTHER): Payer: 59 | Admitting: General Practice

## 2014-12-03 VITALS — BP 112/66 | HR 100

## 2014-12-03 DIAGNOSIS — O30003 Twin pregnancy, unspecified number of placenta and unspecified number of amniotic sacs, third trimester: Secondary | ICD-10-CM

## 2014-12-03 DIAGNOSIS — IMO0001 Reserved for inherently not codable concepts without codable children: Secondary | ICD-10-CM

## 2014-12-03 NOTE — Progress Notes (Signed)
Copy of report and tracing given to patient to bring to Dr Corinna Capra today

## 2014-12-10 ENCOUNTER — Ambulatory Visit (HOSPITAL_COMMUNITY)
Admission: RE | Admit: 2014-12-10 | Discharge: 2014-12-10 | Disposition: A | Discharge status: Home / Self Care | Payer: 59 | Source: Ambulatory Visit | Attending: Obstetrics and Gynecology | Admitting: Obstetrics and Gynecology

## 2014-12-10 DIAGNOSIS — O30009 Twin pregnancy, unspecified number of placenta and unspecified number of amniotic sacs, unspecified trimester: Secondary | ICD-10-CM | POA: Insufficient documentation

## 2014-12-10 DIAGNOSIS — Z3A00 Weeks of gestation of pregnancy not specified: Secondary | ICD-10-CM | POA: Diagnosis not present

## 2014-12-10 NOTE — ED Notes (Signed)
Pt given tracing to take to Dr. Matthew Saras today.

## 2014-12-17 ENCOUNTER — Other Ambulatory Visit (HOSPITAL_COMMUNITY): Payer: 59

## 2014-12-17 ENCOUNTER — Ambulatory Visit (HOSPITAL_COMMUNITY)
Admission: RE | Admit: 2014-12-17 | Discharge: 2014-12-17 | Disposition: A | Discharge status: Home / Self Care | Payer: 59 | Source: Ambulatory Visit | Attending: Obstetrics and Gynecology | Admitting: Obstetrics and Gynecology

## 2014-12-17 DIAGNOSIS — O30009 Twin pregnancy, unspecified number of placenta and unspecified number of amniotic sacs, unspecified trimester: Secondary | ICD-10-CM | POA: Insufficient documentation

## 2014-12-17 DIAGNOSIS — Z3A00 Weeks of gestation of pregnancy not specified: Secondary | ICD-10-CM | POA: Insufficient documentation

## 2014-12-17 NOTE — ED Notes (Signed)
Copy of tracing sent with patient to MD appointment today.

## 2014-12-24 ENCOUNTER — Encounter (HOSPITAL_COMMUNITY): Payer: Self-pay

## 2014-12-24 ENCOUNTER — Ambulatory Visit (HOSPITAL_COMMUNITY)
Admission: RE | Admit: 2014-12-24 | Discharge: 2014-12-24 | Disposition: A | Discharge status: Home / Self Care | Payer: 59 | Source: Ambulatory Visit | Attending: Obstetrics and Gynecology | Admitting: Obstetrics and Gynecology

## 2014-12-24 DIAGNOSIS — Z029 Encounter for administrative examinations, unspecified: Secondary | ICD-10-CM | POA: Insufficient documentation

## 2014-12-24 NOTE — ED Notes (Signed)
Copy of NST tracing sent with pt to MD appt today.

## 2014-12-25 NOTE — H&P (Signed)
Kim Pope is a 29 y.o. female presenting for primary c-section, twins.  Pregnancy uncomplicated.  History OB History    Gravida Para Term Preterm AB TAB SAB Ectopic Multiple Living   1              Past Medical History  Diagnosis Date  . IBS (irritable bowel syndrome)    Past Surgical History  Procedure Laterality Date  . Ovarian cyst removal Right   . Wisdom tooth extraction     Family History: family history includes Diabetes in her maternal grandmother; GER disease in her maternal aunt, maternal grandfather, and mother; Irritable bowel syndrome in her father; Lymphoma in her paternal grandmother. Social History:  reports that she has never smoked. She has never used smokeless tobacco. She reports that she does not drink alcohol or use illicit drugs.   Prenatal Transfer Tool  Maternal Diabetes: No Genetic Screening: Declined Maternal Ultrasounds/Referrals: Normal Fetal Ultrasounds or other Referrals:  None Maternal Substance Abuse:  No Significant Maternal Medications:  None Significant Maternal Lab Results:  None Other Comments:  None  ROS    AF, VSS  Exam Physical Exam  Gen - NAD ABd - gravid, NT Ext - NT, no edema CV - RRR Lungs - clear Prenatal labs: ABO, Rh: --/--/O NEG (10/12 1810) Antibody: POS (10/12 1810) Rubella:   RPR:    HBsAg:    HIV:    GBS:     Assessment/Plan: Primary c-section, twins   Kim Pope 12/25/2014, 11:01 AM

## 2014-12-28 ENCOUNTER — Encounter (HOSPITAL_COMMUNITY)
Admission: RE | Disposition: A | Discharge status: Home / Self Care | Payer: Self-pay | Source: Ambulatory Visit | Attending: Obstetrics & Gynecology

## 2014-12-28 ENCOUNTER — Encounter (HOSPITAL_COMMUNITY): Payer: Self-pay

## 2014-12-28 ENCOUNTER — Inpatient Hospital Stay (HOSPITAL_COMMUNITY)
Admission: RE | Admit: 2014-12-28 | Discharge: 2014-12-31 | DRG: 765 | Disposition: A | Discharge status: Home / Self Care | Payer: 59 | Source: Ambulatory Visit | Attending: Obstetrics & Gynecology | Admitting: Obstetrics & Gynecology

## 2014-12-28 DIAGNOSIS — Z3A36 36 weeks gestation of pregnancy: Secondary | ICD-10-CM

## 2014-12-28 DIAGNOSIS — O321XX2 Maternal care for breech presentation, fetus 2: Secondary | ICD-10-CM | POA: Diagnosis present

## 2014-12-28 DIAGNOSIS — O30043 Twin pregnancy, dichorionic/diamniotic, third trimester: Secondary | ICD-10-CM | POA: Diagnosis present

## 2014-12-28 DIAGNOSIS — O321XX1 Maternal care for breech presentation, fetus 1: Principal | ICD-10-CM | POA: Diagnosis present

## 2014-12-28 DIAGNOSIS — Z98891 History of uterine scar from previous surgery: Secondary | ICD-10-CM

## 2014-12-28 LAB — URINALYSIS, ROUTINE W REFLEX MICROSCOPIC
Bilirubin Urine: NEGATIVE
GLUCOSE, UA: NEGATIVE mg/dL
Ketones, ur: NEGATIVE mg/dL
Nitrite: NEGATIVE
PROTEIN: 30 mg/dL — AB
Specific Gravity, Urine: 1.015 (ref 1.005–1.030)
pH: 7 (ref 5.0–8.0)

## 2014-12-28 LAB — CBC
HCT: 33.2 % — ABNORMAL LOW (ref 36.0–46.0)
Hemoglobin: 10.8 g/dL — ABNORMAL LOW (ref 12.0–15.0)
MCH: 27.6 pg (ref 26.0–34.0)
MCHC: 32.5 g/dL (ref 30.0–36.0)
MCV: 84.7 fL (ref 78.0–100.0)
PLATELETS: 232 10*3/uL (ref 150–400)
RBC: 3.92 MIL/uL (ref 3.87–5.11)
RDW: 14.1 % (ref 11.5–15.5)
WBC: 13.4 10*3/uL — ABNORMAL HIGH (ref 4.0–10.5)

## 2014-12-28 LAB — URINE MICROSCOPIC-ADD ON

## 2014-12-28 SURGERY — Surgical Case
Anesthesia: Spinal | Site: Abdomen

## 2014-12-28 MED ORDER — CITRIC ACID-SODIUM CITRATE 334-500 MG/5ML PO SOLN
30.0000 mL | Freq: Once | ORAL | Status: AC
  Administered 2014-12-28: 30 mL via ORAL
  Filled 2014-12-28: qty 15

## 2014-12-28 MED ORDER — KETOROLAC TROMETHAMINE 30 MG/ML IJ SOLN
30.0000 mg | Freq: Four times a day (QID) | INTRAMUSCULAR | Status: DC | PRN
  Administered 2014-12-29: 30 mg via INTRAMUSCULAR

## 2014-12-28 MED ORDER — PHENYLEPHRINE 8 MG IN D5W 100 ML (0.08MG/ML) PREMIX OPTIME
INJECTION | INTRAVENOUS | Status: AC
  Filled 2014-12-28: qty 100

## 2014-12-28 MED ORDER — FAMOTIDINE IN NACL 20-0.9 MG/50ML-% IV SOLN
20.0000 mg | Freq: Once | INTRAVENOUS | Status: AC
  Administered 2014-12-28: 20 mg via INTRAVENOUS

## 2014-12-28 MED ORDER — LACTATED RINGERS IV SOLN
INTRAVENOUS | Status: DC
  Administered 2014-12-28 – 2014-12-29 (×3): via INTRAVENOUS

## 2014-12-28 MED ORDER — CEFAZOLIN SODIUM-DEXTROSE 2-3 GM-% IV SOLR: 2.0000 g | INTRAVENOUS | Status: DC

## 2014-12-28 MED ORDER — FAMOTIDINE IN NACL 20-0.9 MG/50ML-% IV SOLN
INTRAVENOUS | Status: AC
  Administered 2014-12-28: 20 mg via INTRAVENOUS
  Filled 2014-12-28: qty 50

## 2014-12-28 MED ORDER — CEFAZOLIN SODIUM-DEXTROSE 2-3 GM-% IV SOLR
2.0000 g | INTRAVENOUS | Status: AC
  Administered 2014-12-29: 2 g via INTRAVENOUS

## 2014-12-28 MED ORDER — DEXTROSE IN LACTATED RINGERS 5 % IV SOLN
INTRAVENOUS | Status: DC
Start: 2014-12-28 — End: 2014-12-28

## 2014-12-28 MED ORDER — MORPHINE SULFATE (PF) 0.5 MG/ML IJ SOLN
INTRAMUSCULAR | Status: AC
  Filled 2014-12-28: qty 10

## 2014-12-28 MED ORDER — KETOROLAC TROMETHAMINE 30 MG/ML IJ SOLN: 30.0000 mg | Freq: Four times a day (QID) | INTRAMUSCULAR | Status: DC | PRN

## 2014-12-28 MED ORDER — CEFAZOLIN SODIUM-DEXTROSE 2-3 GM-% IV SOLR
INTRAVENOUS | Status: AC
  Filled 2014-12-28: qty 50

## 2014-12-28 MED ORDER — FENTANYL CITRATE (PF) 100 MCG/2ML IJ SOLN
INTRAMUSCULAR | Status: AC
  Filled 2014-12-28: qty 2

## 2014-12-28 MED ORDER — OXYTOCIN 10 UNIT/ML IJ SOLN
INTRAMUSCULAR | Status: AC
  Filled 2014-12-28: qty 4

## 2014-12-28 MED ORDER — ONDANSETRON HCL 4 MG/2ML IJ SOLN
INTRAMUSCULAR | Status: AC
  Filled 2014-12-28: qty 2

## 2014-12-28 SURGICAL SUPPLY — 38 items
BENZOIN TINCTURE PRP APPL 2/3 (GAUZE/BANDAGES/DRESSINGS) ×3 IMPLANT
CLAMP CORD UMBIL (MISCELLANEOUS) ×6 IMPLANT
CLOSURE WOUND 1/4X4 (GAUZE/BANDAGES/DRESSINGS) ×1
CLOTH BEACON ORANGE TIMEOUT ST (SAFETY) ×3 IMPLANT
DRAPE SHEET LG 3/4 BI-LAMINATE (DRAPES) ×6 IMPLANT
DRSG OPSITE POSTOP 4X10 (GAUZE/BANDAGES/DRESSINGS) ×3 IMPLANT
DURAPREP 26ML APPLICATOR (WOUND CARE) ×3 IMPLANT
ELECT REM PT RETURN 9FT ADLT (ELECTROSURGICAL) ×3
ELECTRODE REM PT RTRN 9FT ADLT (ELECTROSURGICAL) ×1 IMPLANT
EXTRACTOR VACUUM M CUP 4 TUBE (SUCTIONS) IMPLANT
EXTRACTOR VACUUM M CUP 4' TUBE (SUCTIONS)
GLOVE BIO SURGEON STRL SZ 6.5 (GLOVE) ×2 IMPLANT
GLOVE BIO SURGEONS STRL SZ 6.5 (GLOVE) ×1
GLOVE BIOGEL PI IND STRL 7.0 (GLOVE) ×5 IMPLANT
GLOVE BIOGEL PI INDICATOR 7.0 (GLOVE) ×10
GLOVE SURG SS PI 7.0 STRL IVOR (GLOVE) ×3 IMPLANT
GOWN STRL REUS W/TWL LRG LVL3 (GOWN DISPOSABLE) ×9 IMPLANT
KIT ABG SYR 3ML LUER SLIP (SYRINGE) IMPLANT
LIQUID BAND (GAUZE/BANDAGES/DRESSINGS) ×3 IMPLANT
NDL SAFETY ECLIPSE 18X1.5 (NEEDLE) ×1 IMPLANT
NEEDLE HYPO 18GX1.5 SHARP (NEEDLE) ×2
NEEDLE HYPO 25X5/8 SAFETYGLIDE (NEEDLE) IMPLANT
NS IRRIG 1000ML POUR BTL (IV SOLUTION) ×3 IMPLANT
PACK C SECTION WH (CUSTOM PROCEDURE TRAY) ×3 IMPLANT
PAD OB MATERNITY 4.3X12.25 (PERSONAL CARE ITEMS) ×3 IMPLANT
PENCIL SMOKE EVAC W/HOLSTER (ELECTROSURGICAL) ×3 IMPLANT
STRIP CLOSURE SKIN 1/4X4 (GAUZE/BANDAGES/DRESSINGS) ×2 IMPLANT
SUT CHROMIC 0 CT 802H (SUTURE) IMPLANT
SUT CHROMIC 0 CTX 36 (SUTURE) ×9 IMPLANT
SUT MON AB-0 CT1 36 (SUTURE) ×3 IMPLANT
SUT PDS AB 0 CTX 60 (SUTURE) ×3 IMPLANT
SUT PLAIN 0 NONE (SUTURE) IMPLANT
SUT VIC AB 0 CT1 36 (SUTURE) ×3 IMPLANT
SUT VIC AB 4-0 KS 27 (SUTURE) ×3 IMPLANT
SYR BULB 3OZ (MISCELLANEOUS) ×6 IMPLANT
SYRINGE 10CC LL (SYRINGE) ×6 IMPLANT
TOWEL OR 17X24 6PK STRL BLUE (TOWEL DISPOSABLE) ×6 IMPLANT
TRAY FOLEY CATH SILVER 14FR (SET/KITS/TRAYS/PACK) IMPLANT

## 2014-12-28 NOTE — Anesthesia Preprocedure Evaluation (Signed)
Anesthesia Evaluation  Patient identified by MRN, date of birth, ID band Patient awake    Reviewed: Allergy & Precautions, NPO status , Patient's Chart, lab work & pertinent test results  History of Anesthesia Complications Negative for: history of anesthetic complications  Airway Mallampati: II  TM Distance: >3 FB Neck ROM: Full    Dental  (+) Teeth Intact   Pulmonary neg pulmonary ROS,    breath sounds clear to auscultation       Cardiovascular negative cardio ROS   Rhythm:Regular     Neuro/Psych negative neurological ROS  negative psych ROS   GI/Hepatic negative GI ROS, Neg liver ROS,   Endo/Other  negative endocrine ROS  Renal/GU negative Renal ROS     Musculoskeletal   Abdominal   Peds  Hematology negative hematology ROS (+)   Anesthesia Other Findings   Reproductive/Obstetrics (+) Pregnancy                             Anesthesia Physical Anesthesia Plan  ASA: II  Anesthesia Plan: Spinal   Post-op Pain Management:    Induction:   Airway Management Planned: Nasal Cannula, Natural Airway and Simple Face Mask  Additional Equipment: None  Intra-op Plan:   Post-operative Plan:   Informed Consent: I have reviewed the patients History and Physical, chart, labs and discussed the procedure including the risks, benefits and alternatives for the proposed anesthesia with the patient or authorized representative who has indicated his/her understanding and acceptance.   Dental advisory given  Plan Discussed with: CRNA and Surgeon  Anesthesia Plan Comments:         Anesthesia Quick Evaluation

## 2014-12-28 NOTE — H&P (Signed)
Kim Pope is a 29 y.o. female presenting with labor, di/di twins.  CTX started tonight and have intensified.  SVE closed during last OV, tonight 3-4 cm.  Twins breech/breech by last u/s.    Maternal Medical History:  Reason for admission: Contractions.   Contractions: Onset was 3-5 hours ago.   Frequency: regular.   Perceived severity is moderate.    Fetal activity: Perceived fetal activity is normal.   Last perceived fetal movement was within the past hour.    Prenatal Complications - Diabetes: none.    OB History    Gravida Para Term Preterm AB TAB SAB Ectopic Multiple Living   1              Past Medical History  Diagnosis Date  . IBS (irritable bowel syndrome)    Past Surgical History  Procedure Laterality Date  . Ovarian cyst removal Right   . Wisdom tooth extraction     Family History: family history includes Diabetes in her maternal grandmother; GER disease in her maternal aunt, maternal grandfather, and mother; Irritable bowel syndrome in her father; Lymphoma in her paternal grandmother. Social History:  reports that she has never smoked. She has never used smokeless tobacco. She reports that she does not drink alcohol or use illicit drugs.   Prenatal Transfer Tool  Maternal Diabetes: No Genetic Screening: Normal Maternal Ultrasounds/Referrals: Normal Fetal Ultrasounds or other Referrals:  None Maternal Substance Abuse:  No Significant Maternal Medications:  None Significant Maternal Lab Results:  None Other Comments:  None  ROS  Dilation: 3.5 Effacement (%): 70 Station: -2 Exam by:: Arts development officer Blood pressure 130/81, pulse 109, temperature 98.4 F (36.9 C), temperature source Oral, resp. rate 20, height 5\' 7"  (1.702 m), weight 90.266 kg (199 lb), SpO2 100 %. Maternal Exam:  Uterine Assessment: Contraction strength is moderate.  Contraction frequency is regular.   Abdomen: Patient reports no abdominal tenderness. Fundal height is c/w dates.    Estimated fetal weight is 6#/6#.   Fetal presentation: breech     Physical Exam  Constitutional: She is oriented to person, place, and time. She appears well-developed and well-nourished.  GI: Soft. There is no rebound and no guarding.  Neurological: She is alert and oriented to person, place, and time.  Skin: Skin is warm and dry.  Psychiatric: She has a normal mood and affect. Her behavior is normal.    Prenatal labs: ABO, Rh: --/--/O NEG (10/12 1810) Antibody: POS (10/12 1810) Rubella:   RPR:    HBsAg:    HIV:    GBS:     Assessment/Plan: 29yo G1 at [redacted]w[redacted]d with labor, twins with A breech -Primary C/S.  Risks including bleeding, infection, scarring and damage to surrounding structures were discussed with the patient.  She understands the risk of hemorrhage possibly requiring transfusion.  All questions were answered and we will proceed.   Elexa Kivi, [place] 12/28/2014, 10:39 PM

## 2014-12-28 NOTE — MAU Note (Signed)
Pt reports she has been cramping and had bleeding about 20 minutes ago. Some dysuria.

## 2014-12-29 ENCOUNTER — Inpatient Hospital Stay (HOSPITAL_COMMUNITY): Payer: 59 | Admitting: Anesthesiology

## 2014-12-29 ENCOUNTER — Encounter (HOSPITAL_COMMUNITY): Payer: Self-pay | Admitting: *Deleted

## 2014-12-29 DIAGNOSIS — O321XX1 Maternal care for breech presentation, fetus 1: Secondary | ICD-10-CM | POA: Diagnosis present

## 2014-12-29 DIAGNOSIS — Z98891 History of uterine scar from previous surgery: Secondary | ICD-10-CM

## 2014-12-29 DIAGNOSIS — Z3A36 36 weeks gestation of pregnancy: Secondary | ICD-10-CM | POA: Diagnosis not present

## 2014-12-29 DIAGNOSIS — O321XX2 Maternal care for breech presentation, fetus 2: Secondary | ICD-10-CM | POA: Diagnosis present

## 2014-12-29 DIAGNOSIS — O30043 Twin pregnancy, dichorionic/diamniotic, third trimester: Secondary | ICD-10-CM | POA: Diagnosis present

## 2014-12-29 DIAGNOSIS — Z3403 Encounter for supervision of normal first pregnancy, third trimester: Secondary | ICD-10-CM | POA: Diagnosis present

## 2014-12-29 MED ORDER — MENTHOL 3 MG MT LOZG
1.0000 | LOZENGE | OROMUCOSAL | Status: DC | PRN
Start: 2014-12-29 — End: 2014-12-31

## 2014-12-29 MED ORDER — SIMETHICONE 80 MG PO CHEW
80.0000 mg | CHEWABLE_TABLET | Freq: Three times a day (TID) | ORAL | Status: DC
  Administered 2014-12-29 – 2014-12-31 (×7): 80 mg via ORAL
  Filled 2014-12-29 (×7): qty 1

## 2014-12-29 MED ORDER — MEPERIDINE HCL 25 MG/ML IJ SOLN: 6.2500 mg | INTRAMUSCULAR | Status: DC | PRN

## 2014-12-29 MED ORDER — DIPHENHYDRAMINE HCL 25 MG PO CAPS: 25.0000 mg | ORAL_CAPSULE | Freq: Four times a day (QID) | ORAL | Status: DC | PRN

## 2014-12-29 MED ORDER — NALOXONE HCL 2 MG/2ML IJ SOSY
1.0000 ug/kg/h | PREFILLED_SYRINGE | INTRAMUSCULAR | Status: DC | PRN
  Filled 2014-12-29: qty 2

## 2014-12-29 MED ORDER — IBUPROFEN 600 MG PO TABS
600.0000 mg | ORAL_TABLET | Freq: Four times a day (QID) | ORAL | Status: DC
  Administered 2014-12-29 – 2014-12-31 (×9): 600 mg via ORAL
  Filled 2014-12-29 (×9): qty 1

## 2014-12-29 MED ORDER — NALBUPHINE HCL 10 MG/ML IJ SOLN: 5.0000 mg | INTRAMUSCULAR | Status: DC | PRN

## 2014-12-29 MED ORDER — DIBUCAINE 1 % RE OINT: 1.0000 "application " | TOPICAL_OINTMENT | RECTAL | Status: DC | PRN

## 2014-12-29 MED ORDER — SENNOSIDES-DOCUSATE SODIUM 8.6-50 MG PO TABS
2.0000 | ORAL_TABLET | ORAL | Status: DC
  Administered 2014-12-29 – 2014-12-30 (×2): 2 via ORAL
  Filled 2014-12-29 (×2): qty 2

## 2014-12-29 MED ORDER — ONDANSETRON HCL 4 MG/2ML IJ SOLN: 4.0000 mg | Freq: Three times a day (TID) | INTRAMUSCULAR | Status: DC | PRN

## 2014-12-29 MED ORDER — SCOPOLAMINE 1 MG/3DAYS TD PT72
MEDICATED_PATCH | TRANSDERMAL | Status: DC | PRN
  Administered 2014-12-29: 1 via TRANSDERMAL

## 2014-12-29 MED ORDER — SCOPOLAMINE 1 MG/3DAYS TD PT72
MEDICATED_PATCH | TRANSDERMAL | Status: AC
  Filled 2014-12-29: qty 1

## 2014-12-29 MED ORDER — SODIUM CHLORIDE 0.9 % IR SOLN
Status: DC | PRN
  Administered 2014-12-29: 1000 mL

## 2014-12-29 MED ORDER — OXYTOCIN 10 UNIT/ML IJ SOLN
40.0000 [IU] | INTRAMUSCULAR | Status: DC | PRN
  Administered 2014-12-29: 40 [IU] via INTRAVENOUS

## 2014-12-29 MED ORDER — WITCH HAZEL-GLYCERIN EX PADS: 1.0000 "application " | MEDICATED_PAD | CUTANEOUS | Status: DC | PRN

## 2014-12-29 MED ORDER — SIMETHICONE 80 MG PO CHEW
80.0000 mg | CHEWABLE_TABLET | ORAL | Status: DC
  Administered 2014-12-29 – 2014-12-30 (×2): 80 mg via ORAL
  Filled 2014-12-29 (×2): qty 1

## 2014-12-29 MED ORDER — FENTANYL CITRATE (PF) 100 MCG/2ML IJ SOLN
INTRAMUSCULAR | Status: DC | PRN
  Administered 2014-12-29: 20 ug via INTRATHECAL

## 2014-12-29 MED ORDER — NALBUPHINE HCL 10 MG/ML IJ SOLN: 5.0000 mg | Freq: Once | INTRAMUSCULAR | Status: DC | PRN

## 2014-12-29 MED ORDER — KETOROLAC TROMETHAMINE 30 MG/ML IJ SOLN
INTRAMUSCULAR | Status: AC
  Filled 2014-12-29: qty 1

## 2014-12-29 MED ORDER — LACTATED RINGERS IV SOLN
INTRAVENOUS | Status: DC
Start: 2014-12-29 — End: 2014-12-31
  Administered 2014-12-29 (×2): via INTRAVENOUS

## 2014-12-29 MED ORDER — PRENATAL MULTIVITAMIN CH
1.0000 | ORAL_TABLET | Freq: Every day | ORAL | Status: DC
  Administered 2014-12-29 – 2014-12-31 (×3): 1 via ORAL
  Filled 2014-12-29 (×3): qty 1

## 2014-12-29 MED ORDER — DIPHENHYDRAMINE HCL 25 MG PO CAPS
25.0000 mg | ORAL_CAPSULE | ORAL | Status: DC | PRN
  Filled 2014-12-29: qty 1

## 2014-12-29 MED ORDER — BUPIVACAINE IN DEXTROSE 0.75-8.25 % IT SOLN
INTRATHECAL | Status: DC | PRN
  Administered 2014-12-29: 12 mg via INTRATHECAL

## 2014-12-29 MED ORDER — SODIUM CHLORIDE 0.9 % IJ SOLN: 3.0000 mL | INTRAMUSCULAR | Status: DC | PRN

## 2014-12-29 MED ORDER — PROMETHAZINE HCL 25 MG/ML IJ SOLN
6.2500 mg | Freq: Once | INTRAMUSCULAR | Status: AC
  Administered 2014-12-29: 6.25 mg via INTRAVENOUS

## 2014-12-29 MED ORDER — ZOLPIDEM TARTRATE 5 MG PO TABS: 5.0000 mg | ORAL_TABLET | Freq: Every evening | ORAL | Status: DC | PRN

## 2014-12-29 MED ORDER — OXYCODONE-ACETAMINOPHEN 5-325 MG PO TABS: 2.0000 | ORAL_TABLET | ORAL | Status: DC | PRN

## 2014-12-29 MED ORDER — MORPHINE SULFATE (PF) 0.5 MG/ML IJ SOLN
INTRAMUSCULAR | Status: DC | PRN
  Administered 2014-12-29: .2 mg via INTRATHECAL

## 2014-12-29 MED ORDER — LACTATED RINGERS IV SOLN
INTRAVENOUS | Status: DC | PRN
  Administered 2014-12-29: 01:00:00 via INTRAVENOUS

## 2014-12-29 MED ORDER — PROMETHAZINE HCL 25 MG/ML IJ SOLN
INTRAMUSCULAR | Status: AC
  Filled 2014-12-29: qty 1

## 2014-12-29 MED ORDER — DIPHENHYDRAMINE HCL 50 MG/ML IJ SOLN: 12.5000 mg | INTRAMUSCULAR | Status: DC | PRN

## 2014-12-29 MED ORDER — LACTATED RINGERS IV BOLUS (SEPSIS)
500.0000 mL | Freq: Once | INTRAVENOUS | Status: AC
  Administered 2014-12-29: 500 mL via INTRAVENOUS

## 2014-12-29 MED ORDER — SIMETHICONE 80 MG PO CHEW: 80.0000 mg | CHEWABLE_TABLET | ORAL | Status: DC | PRN

## 2014-12-29 MED ORDER — OXYTOCIN 40 UNITS IN LACTATED RINGERS INFUSION - SIMPLE MED: 62.5000 mL/h | INTRAVENOUS | Status: AC

## 2014-12-29 MED ORDER — NALBUPHINE HCL 10 MG/ML IJ SOLN
5.0000 mg | INTRAMUSCULAR | Status: DC | PRN
  Administered 2014-12-29: 5 mg via INTRAVENOUS
  Filled 2014-12-29: qty 1

## 2014-12-29 MED ORDER — PHENYLEPHRINE 8 MG IN D5W 100 ML (0.08MG/ML) PREMIX OPTIME
INJECTION | INTRAVENOUS | Status: DC | PRN
  Administered 2014-12-29: 60 ug/min via INTRAVENOUS

## 2014-12-29 MED ORDER — ONDANSETRON HCL 4 MG/2ML IJ SOLN
INTRAMUSCULAR | Status: DC | PRN
  Administered 2014-12-29: 4 mg via INTRAVENOUS

## 2014-12-29 MED ORDER — OXYCODONE-ACETAMINOPHEN 5-325 MG PO TABS
1.0000 | ORAL_TABLET | ORAL | Status: DC | PRN
  Administered 2014-12-29 – 2014-12-31 (×6): 1 via ORAL
  Filled 2014-12-29 (×6): qty 1

## 2014-12-29 MED ORDER — LANOLIN HYDROUS EX OINT: 1.0000 "application " | TOPICAL_OINTMENT | CUTANEOUS | Status: DC | PRN

## 2014-12-29 MED ORDER — SCOPOLAMINE 1 MG/3DAYS TD PT72
1.0000 | MEDICATED_PATCH | Freq: Once | TRANSDERMAL | Status: DC
  Filled 2014-12-29: qty 1

## 2014-12-29 MED ORDER — ACETAMINOPHEN 325 MG PO TABS: 650.0000 mg | ORAL_TABLET | ORAL | Status: DC | PRN

## 2014-12-29 MED ORDER — FENTANYL CITRATE (PF) 100 MCG/2ML IJ SOLN: 25.0000 ug | INTRAMUSCULAR | Status: DC | PRN

## 2014-12-29 MED ORDER — NALOXONE HCL 0.4 MG/ML IJ SOLN: 0.4000 mg | INTRAMUSCULAR | Status: DC | PRN

## 2014-12-29 MED ORDER — TETANUS-DIPHTH-ACELL PERTUSSIS 5-2.5-18.5 LF-MCG/0.5 IM SUSP: 0.5000 mL | Freq: Once | INTRAMUSCULAR | Status: DC

## 2014-12-29 NOTE — Lactation Note (Signed)
This note was copied from the chart of Kim Iceland. Lactation Consultation Note  Patient Name: Kim Pope [id number] Date: 12/29/2014 Reason for consult: Initial assessment;Infant < 6lbs;Multiple gestation;Late preterm infant Baby B awake and giving feeding ques. Assisted Mom with latch, baby having difficulty sustaining the latch at this time. Will take few good suckles but no swallows noted. Able to hand express and drip colostrum in baby's mouth to help with latch. Baby B has some chin recession and short labial frenulum which may be affecting baby's ability to sustain the latch. Will see if this improves with time.  Baby was at the breast on/off for 5 minutes then was supplemented with 6 ml of EBM via 5 fr feeding tube/syringe. Demonstrated to [facility] how to supplement and she demonstrated back.   Baby A awake and giving feeding ques. Assisted Mom with this baby as well, same experience. Baby nursed off/on for 5 minutes with few good suckles noted. Baby A has very recessed chin which seems to be making if difficult for baby to sustain the latch. Will see if this improves with time. Baby A took 5 ml of EBM via 5 fr feeding tube/syringe from [facility].   Late preterm behaviors and policy reviewed with Mom. Plan at this time is to BF with feeding ques but at least every 3 hours. Mom has lots of colostrum with hand expression. Was able to get 11 ml this visit to give baby to babies. Plan discussed: Hand express prior to latch. Let baby's nurse up to 30 minutes. Post pump and supplement according to LPT with EBM/formula if needed. Advised Mom to let FOB or family give supplement while Mom pumps. Advised to supplement with 5-10 ml each feeding. If not going to breast then supplement with 10 ml increasing per guidelines each day.  Mom to call for assist with latch.  Lactation brochure left for review, advised of OP services and support groups.   Maternal Data Has patient been taught Hand  Expression?: Yes Does the patient have breastfeeding experience prior to this delivery?: No  Feeding Feeding Type: Breast Milk Length of feed: 5 min  LATCH Score/Interventions Latch: Repeated attempts needed to sustain latch, nipple held in mouth throughout feeding, stimulation needed to elicit sucking reflex. Intervention(s): Adjust position;Assist with latch;Breast massage;Breast compression  Audible Swallowing: None  Type of Nipple: Everted at rest and after stimulation (short nipple shafts bilateral)  Comfort (Breast/Nipple): Soft / non-tender     Hold (Positioning): Assistance needed to correctly position infant at breast and maintain latch. Intervention(s): Breastfeeding basics reviewed;Support Pillows;Position options;Skin to skin  LATCH Score: 6  Lactation Tools Discussed/Used Tools: Pump Breast pump type: Double-Electric Breast Pump WIC Program: No   Consult Status Consult Status: Follow-up Date: 12/30/14 Follow-up type: In-patient    Katrine Coho 12/29/2014, 2:47 PM

## 2014-12-29 NOTE — Transfer of Care (Signed)
Immediate Anesthesia Transfer of Care Note  Patient: Kim Pope  Procedure(s) Performed: Procedure(s) with comments: CESAREAN SECTION MULTI-GESTATIONAL (N/A) - Primary edc 01/22/15 NKDA ok to post at 2pm per Manon Hilding  Patient Location: PACU  Anesthesia Type:Spinal  Level of Consciousness: awake  Airway & Oxygen Therapy: Patient Spontanous Breathing  Post-op Assessment: Report given to RN and Post -op Vital signs reviewed and stable  Post vital signs: stable  Last Vitals:  Filed Vitals:   12/28/14 2045  BP: 130/81  Pulse: 109  Temp: 36.9 C  Resp: 20    Complications: No apparent anesthesia complications

## 2014-12-29 NOTE — Progress Notes (Signed)
Patient's urine output for 0400-0700 only 75 cc. Fundal and lochia assessment WNL. LR with pitocin still infusing. Dr. Lynnette Caffey called but in [place] at 0708. Received call back from Dr. Lynnette Caffey at [phone] and order received to stop LR with pitocin and given LR bolus then LR at 125cc/hr. Will start bolus.On-coming nurse present at bedside and aware of plan.

## 2014-12-29 NOTE — Op Note (Signed)
[place] C Gibbons PROCEDURE DATE: 12/28/2014 - 12/29/2014  PREOPERATIVE DIAGNOSIS: Intrauterine pregnancy at  [redacted]w[redacted]d weeks gestation, Di/Di Twins with A presenting breech, labor  POSTOPERATIVE DIAGNOSIS: The same  PROCEDURE:    Low Transverse Cesarean Section  SURGEON:  Dr. Linda Hedges  INDICATIONS: KWANZAA FUJITANI is a 29 y.o. G1P0 at [redacted]w[redacted]d scheduled for cesarean section secondary to labor with twins, A breech.  The risks of cesarean section discussed with the patient included but were not limited to: bleeding which may require transfusion or reoperation; infection which may require antibiotics; injury to bowel, bladder, ureters or other surrounding organs; injury to the fetus; need for additional procedures including hysterectomy in the event of a life-threatening hemorrhage; placental abnormalities wth subsequent pregnancies, incisional problems, thromboembolic phenomenon and other postoperative/anesthesia complications. The patient concurred with the proposed plan, giving informed written consent for the procedure.    FINDINGS:  A-Viable female infant in breech presentation, B-Viable female infant in breech presentation  Clear amniotic fluid.  Intact placenta, three vessel cord.  Grossly normal uterus, ovaries and fallopian tubes. .   ANESTHESIA:  Spinal ESTIMATED BLOOD LOSS: 700 ml SPECIMENS: Placenta sent to pathology COMPLICATIONS: None immediate  PROCEDURE IN DETAIL:  The patient received intravenous antibiotics and had sequential compression devices applied to her lower extremities while in the preoperative area.  She was then taken to the operating room where spinal anesthesia was administered and was found to be adequate. She was then placed in a dorsal supine position with a leftward tilt, and prepped and draped in a sterile manner.  A foley catheter was placed into her bladder and attached to constant gravity.  After an adequate timeout was performed, a Pfannenstiel skin incision  was made with scalpel and carried through to the underlying layer of fascia. The fascia was incised in the midline and this incision was extended bilaterally using the Mayo scissors. Kocher clamps were applied to the superior aspect of the fascial incision and the underlying rectus muscles were dissected off bluntly. A similar process was carried out on the inferior aspect of the facial incision. The rectus muscles were separated in the midline bluntly and the peritoneum was entered bluntly.  A bladder flap was created sharply and developed bluntly.  A transverse hysterotomy was made with a scalpel and extended bilaterally bluntly. The bladder blade was then removed. Twin A was successfully delivered using typical breech maneuvers and cord was clamped and cut and infant was handed over to awaiting neonatology team. Twin B was successfully delivered using typical breech maneuvers and cord was clamped and cut and infant was handed over to awaiting neonatology team.  Uterine massage was then administered and the placenta delivered intact with three-vessel cord. The uterus was cleared of clot and debris.  The hysterotomy was closed with 0 chromic.  A second imbricating suture of 0-chromic was used to reinforce the incision and aid in hemostasis.  The peritoneum and rectus muscles were noted to be hemostatic and were reapproximated using 3-0 monocryl in a running fashion.  The fascia was closed with 0-Vicryl in a running fashion with good restoration of anatomy.  The subcutaneus tissue was copiously irrigated.  The skin was closed with 4-0 Rapide in a subcuticular fashion.  Pt tolerated the procedure will.  All counts were correct x2.  Pt went to the recovery room in stable condition.

## 2014-12-29 NOTE — Progress Notes (Signed)
Subjective: Postpartum Day **0*: Cesarean Delivery Patient reports tolerating PO.  Breastfeeding infants  Objective: Vital signs in last 24 hours: Temp:  [97.3 F (36.3 C)-99.2 F (37.3 C)] 99.2 F (37.3 C) (11/21 0605) Pulse Rate:  [68-109] 78 (11/21 0605) Resp:  [11-21] 18 (11/21 0605) BP: (101-130)/(59-86) 107/61 mmHg (11/21 0605) SpO2:  [98 %-100 %] 98 % (11/21 0605) Weight:  [199 lb (90.266 kg)] 199 lb (90.266 kg) (11/20 2045)  Physical Exam:  General: alert and cooperative Lochia: appropriate Uterine Fundus: firm Incision: healing well DVT Evaluation: No evidence of DVT seen on physical exam. Negative Homan's sign. No cords or calf tenderness. No significant calf/ankle edema. Urine out put increased , urine clear, non- concentrated  Recent Labs  12/28/14 2155  HGB 10.8*  HCT 33.2*    Assessment/Plan: Status post Cesarean section. Doing well postoperatively.  Continue current care.  CURTIS,CAROL G 12/29/2014, 8:44 AM

## 2014-12-29 NOTE — Anesthesia Postprocedure Evaluation (Signed)
  Anesthesia Post-op Note  Patient: Kim Pope  Procedure(s) Performed: Procedure(s) with comments: CESAREAN SECTION MULTI-GESTATIONAL (N/A) - Primary edc 01/22/15 NKDA ok to post at 2pm per Manon Hilding  Patient Location: Mother/Baby  Anesthesia Type:Spinal  Level of Consciousness: awake, alert , oriented and patient cooperative  Airway and Oxygen Therapy: Patient Spontanous Breathing  Post-op Pain: none  Post-op Assessment: Post-op Vital signs reviewed, Patient's Cardiovascular Status Stable, Respiratory Function Stable, Patent Airway, No headache, No backache and Patient able to bend at knees  Post-op Vital Signs: Reviewed and stable  Last Vitals:  Filed Vitals:   12/29/14 0500 12/29/14 0605  BP: 122/66 107/61  Pulse: 80 78  Temp: 37 C 37.3 C  Resp: 18 18    Complications: No apparent anesthesia complications

## 2014-12-29 NOTE — Anesthesia Postprocedure Evaluation (Signed)
Anesthesia Post Note  Patient: Kim Pope  Procedure(s) Performed: Procedure(s) (LRB): CESAREAN SECTION MULTI-GESTATIONAL (N/A)  Patient location during evaluation: PACU Anesthesia Type: Spinal Level of consciousness: awake and alert Pain management: pain level controlled Vital Signs Assessment: post-procedure vital signs reviewed and stable Respiratory status: spontaneous breathing Cardiovascular status: stable Postop Assessment: No headache, Spinal receding and No signs of nausea or vomiting Anesthetic complications: no    Last Vitals:  Filed Vitals:   12/29/14 0353 12/29/14 0403  BP:  101/86  Pulse:  81  Temp: 36.7 C 36.8 C  Resp:  16    Last Pain:  Filed Vitals:   12/29/14 0411  PainSc: 0-No pain    LLE Motor Response: Purposeful movement LLE Sensation: Increased RLE Motor Response: Purposeful movement RLE Sensation: Increased      [name]

## 2014-12-29 NOTE — Addendum Note (Signed)
Addendum  created 12/29/14 [id number] by Raenette Rover, CRNA   Modules edited: Notes Section   Notes Section:  File: [id number]

## 2014-12-29 NOTE — Anesthesia Procedure Notes (Signed)
Spinal Patient location during procedure: OR Staffing Anesthesiologist: Suede Greenawalt Preanesthetic Checklist Completed: patient identified, surgical consent, pre-op evaluation, timeout performed, IV checked, risks and benefits discussed and monitors and equipment checked Spinal Block Patient position: sitting Prep: ChloraPrep and site prepped and draped Patient monitoring: heart rate, cardiac monitor, continuous pulse ox and blood pressure Approach: midline Location: L3-4 Injection technique: single-shot Needle Needle type: Pencan  Needle gauge: 24 G Needle length: 10 cm Assessment Sensory level: T4

## 2014-12-29 NOTE — Lactation Note (Signed)
This note was copied from the chart of Kim Iceland. Lactation Consultation Note  Patient Name: Kim Pope [id number] Date: 12/29/2014 Reason for consult: Follow-up assessment RN requested help with latch and finger feeding.  Baby A: Baby A is not really interested in feeding. Mom applied a #24 NS and threaded the 5 Fr feeding tube through the tip. She gave 5 ml of the 22 cal formula while the baby was latched. The feeding took about 20 but getting baby to latch took about 45 minutes.   Baby B: Baby B is very eager to latch. She can go to breast with out the NS for about 2-3 minutes. She does very well with the NS. Mom repeated the same type of feeding with the #24 ND and the 5 Fr feeding and 5 ml of 22 cal formula. Baby B stayed on the breast for about 30 minutes.  Mom was able to set up the feeding device and get the babies latched with no assistance. FOB is on the way to stay the night with her and she stated that he can help if she needs it. Mom is aware of the LPT infant feeding guidelines. She will start pumping tomorrow when she feels better. For now she is ok with latching with the NS and using formula. Her goal is to ebf both babies.   Maternal Data    Feeding Feeding Type: Breast Fed Length of feed: 20 min  LATCH Score/Interventions                      Lactation Tools Discussed/Used Tools: Nipple Shields;68F feeding tube / Syringe Nipple shield size: 24   Consult Status Consult Status: Follow-up Date: 12/30/14 Follow-up type: In-patient    Denzil Hughes 12/29/2014, 11:07 PM

## 2014-12-30 LAB — CBC
HCT: 24.5 % — ABNORMAL LOW (ref 36.0–46.0)
HEMOGLOBIN: 8.1 g/dL — AB (ref 12.0–15.0)
MCH: 28.4 pg (ref 26.0–34.0)
MCHC: 33.1 g/dL (ref 30.0–36.0)
MCV: 86 fL (ref 78.0–100.0)
Platelets: 192 10*3/uL (ref 150–400)
RBC: 2.85 MIL/uL — AB (ref 3.87–5.11)
RDW: 14.3 % (ref 11.5–15.5)
WBC: 10.7 10*3/uL — AB (ref 4.0–10.5)

## 2014-12-30 LAB — RPR: RPR Ser Ql: NONREACTIVE

## 2014-12-30 MED ORDER — RHO D IMMUNE GLOBULIN 1500 UNIT/2ML IJ SOSY
300.0000 ug | PREFILLED_SYRINGE | Freq: Once | INTRAMUSCULAR | Status: AC
  Administered 2014-12-30: 300 ug via INTRAVENOUS
  Filled 2014-12-30: qty 2

## 2014-12-30 MED ORDER — FERROUS SULFATE 325 (65 FE) MG PO TABS
325.0000 mg | ORAL_TABLET | Freq: Two times a day (BID) | ORAL | Status: DC
  Administered 2014-12-30 – 2014-12-31 (×3): 325 mg via ORAL
  Filled 2014-12-30 (×3): qty 1

## 2014-12-30 NOTE — Progress Notes (Signed)
CLINICAL SOCIAL WORK MATERNAL/CHILD NOTE  Patient Details  Name: Kim Pope MRN: [id number] Date of Birth: 12/29/2012  Date:  12/30/2014  Clinical Social Worker Initiating Note:  Elissa Hefty, MSW intern  Date/ Time Initiated:  12/30/14/0915     Child's Name:  Kim Pope and [facility]    Legal Guardian:  Kim Pope and Kim Pope    Need for Interpreter:  None   Date of Referral:  12/29/14     Reason for Referral:  Behavioral Health Issues, including SI    Referral Source:  [facility]   Address:  [address], [place] [address]  Phone number:  [phone]   Household Members:  Self, Spouse   Natural Supports (not living in the home):  Immediate Family, Spouse/significant other, Parent, Friends   Medical illustrator Supports: None   Employment: Full-time   Type of Work: Print production planner    Education:  Engineer, maintenance Resources:  Multimedia programmer   Other Resources:      Cultural/Religious Considerations Which May Impact Care:  None reported   Strengths:  Ability to meet basic needs , Home prepared for child    Risk Factors/Current Problems:  Mental Health Concerns - MOB presents with a mild history of generalized anxiety. MOB denied being on prescribed medications or attending therapy. MOB stated her last two months of the pregnancy were the hardest because she was put on bed rest and was under a lot of pain.   Cognitive State:  Insightful , Linear Thinking , Able to Concentrate    Mood/Affect:  Happy , Interested , Calm , Comfortable , Relaxed    CSW Assessment:  MSW intern presented in patient's room due to a history of mild generalized anxiety. MOB's mother and sister were present in the room and MOB provided verbal consent for MSW intern to engage. MOB presented to be in a happy mood as evidence by her willingness to openly answer questions and smiling during the assessment. MOB did state she was  under a lot of pain because of the c-section. Per MOB, she was scheduled to have a c-section on 12/2 but woke up earlier this week bleeding and had to move up the c-section. According to MOB, she is happy with the birthing process and glad that her twins are healthy despite the fact they were born earlier than planned. MOB shared the only thing she did not like about the birthing process was the pain and discomfort. MOB expressed having difficulty breastfeeding because the infants will not stay latched on for long. MOB shared she is pumping out her breast milk and formula feeding for the moment but has received help from lactation. MOB shared she did not like how rough the lactation consultants were with her and the infants when helping her breastfeed. MOB voiced feelings of frustration with the breastfeeding process but shared she is okay with the current feeding method and understand that it is a learning process and she just needs to keep trying. MSW intern validated MOB's feelings and inquired about her feelings in regard to breastfeeding and the different feeding alternatives in the future. MOB shared she will continue to try getting them to latch on and pumping her breast milk every three hours. She also acknowledged that this is all new for the infants and shared that the pediatrician told her it may take some time because the infants mouths are very small. MSW intern provided words of encouragement to MOB by  validating her feelings and providing motivational interviewing in regard to how she may feel in the future about breastfeeding.   MSW intern inquired about MOB's support system and employment. MOB shared she has a great support system from her family and FOB. 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 MOB shared FOB is taking a week off work and that he  had just left the hospital to go home and get some rest. MOB reported having met all of the twins basic needs  and being excited to take them home. Per MOB, both FOB and her are very excited and happy about having twins. MOB shared FOB is already "whipped" with his girls and very happy. MOB expressed how happy she is as well and did not voice any feelings of anxiety about having twins as a first time mother.   MSW intern inquired about MOB's history of anxiety. MOB shared she had a difficult last two months because she was put on bed rest. MOB stated she was under a lot of pain and spend most of her time at home alone. MOB reported feeling depressed the last two weeks and not wanting to get out of bed. MSW intern inquired about some of the symptoms she felt during these period. MOB expressed not liking the fact she could not continue her normal schedule and having to stay at home from work, she shared  it made her feel sad and she just didn't have the energy to get out of bed or engage in other activities. MOB voiced being very communicative with her mother about her feelings and her being a good support sytem.  MOB shared her employer is very supportive and was okay with her staying at home but she did not like being alone. MOB denied being on prescribed medications prior to the pregnancy or attending therapy. MOB reported her anxiety is very mild and usually only has symptoms when under a lot of stress. MOB did not voice any concerns about her anxiety and shared she is transitioning well into postpartum and feels good since the twins were born. MSW intern provided education on perinatal mood disorders and provided MOB with two handouts. MOB reported that her older sister went through PPD and she saw her as a good support system in case she experienced the same symptoms postpartum. MOB voiced being a bit concerned about how the breastfeeding process would affect her but denied interest in starting medications or seeking help at the moment. MOB shared she feels well supported by her family and is planning to take some time  to rest and just get adjusted to her new schedule.  At this point one of the infant's was hungry and MOB was starting to disengage from the assessment, MOB wanted to try and get the infant latched on in order to breastfeed. MSW intern provided a brief overview of the information provided and told MOB if questions arise she could tell her RN to contact the social worker and she would come back.  MOB did express that she would be very cautious about how she feels these next few weeks and stated that she will communicate with her support system and contact her OB if needs arise. MOB denied having any further questions or concerns but agreed to contact MSW intern if needs arise.    CSW Plan/Description:   Engineer, mining- MSW intern provided education on perinatal mood disorders and the hospital's support group, "  Feelings After Birth." No Further Intervention Required/No Barriers to Discharge    Trevor Iha, Student-SW 12/30/2014, 9:36 AM

## 2014-12-30 NOTE — Progress Notes (Signed)
Subjective: Postpartum Day one: Cesarean Delivery Patient reports tolerating PO, + flatus and no problems voiding.    Objective: Vital signs in last 24 hours: Temp:  [97.5 F (36.4 C)-98.6 F (37 C)] 98.2 F (36.8 C) (11/22 0545) Pulse Rate:  [73-120] 97 (11/22 0545) Resp:  [18] 18 (11/22 0545) BP: (95-118)/(51-82) 117/63 mmHg (11/22 0545) SpO2:  [97 %-98 %] 97 % (11/22 0125)  Physical Exam:  General: alert Lochia: appropriate Uterine Fundus: firm Incision: healing well DVT Evaluation: No evidence of DVT seen on physical exam.   Recent Labs  12/28/14 2155 12/30/14 0605  HGB 10.8* 8.1*  HCT 33.2* 24.5*    Assessment/Plan: Status post Cesarean section. Doing well postoperatively.  Continue current care.  [place] Perra S 12/30/2014, 7:50 AM

## 2014-12-30 NOTE — Lactation Note (Signed)
This note was copied from the chart of [place]. Lactation Consultation Note  Patient Name: Kim Pope [id number] Date: 12/30/2014 Reason for consult: Follow-up assessment;Infant < 6lbs;Late preterm infant;Multiple gestation. Baby B, Ashlyn, was placed skin to skin with mom in football hold, and eagerly latched and stayed latched with active sucking for about 15 minutes. Both babies have somewhat recessed chins. I advised mom to begin tummy time , about 3 times a day, with the twins, to allow neck and mouth muscles to release, and possibly allow their chins to move forward. I explained they may not like tummy time at first, but to gently introduce them to this.  Mom was so happy that both babies were skin to skin with her and breast feeding at the same time. Ashlyn then bottle fed about 2 ml's of EBM, and was bottle fed Neosure 22 cal, offered 20 ml's (not sure how much she took).  Please see not on Scottie, Baby A, to complete information on this consult. Mom knows to call for questions/concerns.    Maternal Data    Feeding Feeding Type: Breast Milk Length of feed: 15 min  LATCH Score/Interventions Latch: Grasps breast easily, tongue down, lips flanged, rhythmical sucking. Intervention(s): Skin to skin;Teach feeding cues;Waking techniques Intervention(s): Adjust position;Assist with latch;Breast compression  Audible Swallowing: Spontaneous and intermittent  Type of Nipple: Everted at rest and after stimulation  Comfort (Breast/Nipple): Soft / non-tender     Hold (Positioning): Assistance needed to correctly position infant at breast and maintain latch. (mom fed both babies at same time, in football hold, with assistance) Intervention(s): Breastfeeding basics reviewed;Support Pillows;Position options;Skin to skin  LATCH Score: 9  Lactation Tools Discussed/Used Pump Review: Setup, frequency, and cleaning;Milk Storage;Other (comment)   Consult Status Consult  Status: Follow-up Date: 12/31/14 Follow-up type: In-patient    Tonna Corner 12/30/2014, 4:33 PM

## 2014-12-30 NOTE — Lactation Note (Signed)
This note was copied from the chart of Kim [place]. Lactation Consultation Note  Patient Name: Kim Pope [id number] Date: 12/30/2014 Reason for consult: Follow-up assessment;Multiple gestation;Late preterm infant;Infant < 6lbs   . I reviewed with this first time mom of LPI twins, the LPI policy for feeding. I told mom to put the babies to breast if they seemed awake and alert and showing cues, but that it was not necessary to breast feed each baby every 3 hours. The important thing is for mom ot protect her milk supply with pumping/skin to skin, and to feed the babies, EBM first, and then add formula up to amounts set by policy. Mom said this made her feel better, less stressed, to not worry so much about breast feeding every time. I also explained that lactation will continue to see her and babies after they are discharge, to help her transition them to full breast feeding. i advised mom to make an o/p consult appointment at her discharge home.  Scottie, Baby A latched on and off, in football hold, for about 5 full minutes of sucking. She then was bottle fed, while still skin to skin with mom, by mom's sister, and took about 4 ml's EBM and 15 ml's Neosure 22. Mom was able to breast feed both babies at the same time, with my help and the help of her mom and sister. Mom very pleased, glad to be holding them skin to skin, and motivated to do her best to follow a n every 3 hours plan of feeding(maybe breast, definitely bottle, and pumping) Mom knows to call for questions/concerns.    Maternal Data    Feeding Feeding Type: Breast Milk Length of feed: 5 min  LATCH Score/Interventions Latch: Repeated attempts needed to sustain latch, nipple held in mouth throughout feeding, stimulation needed to elicit sucking reflex. Intervention(s): Skin to skin;Teach feeding cues;Waking techniques Intervention(s): Adjust position;Assist with latch  Audible Swallowing: A few with stimulation (easily  expressed colostrum expressed into baby's mouht) Intervention(s): Skin to skin;Hand expression  Type of Nipple: Everted at rest and after stimulation  Comfort (Breast/Nipple): Soft / non-tender     Hold (Positioning): Assistance needed to correctly position infant at breast and maintain latch. Intervention(s): Breastfeeding basics reviewed;Support Pillows;Position options;Skin to skin  LATCH Score: 7  Lactation Tools Discussed/Used Pump Review: Setup, frequency, and cleaning;Milk Storage;Other (comment) (hand expression reivewed and initiation setting) Initiated by:: bedsice RN  Date initiated:: 12/29/14   Consult Status Consult Status: Follow-up Date: 12/31/14 Follow-up type: In-patient    Tonna Corner 12/30/2014, 4:23 PM

## 2014-12-31 ENCOUNTER — Ambulatory Visit (HOSPITAL_COMMUNITY): Payer: 59

## 2014-12-31 LAB — RH IG WORKUP (INCLUDES ABO/RH)
ABO/RH(D): O NEG
FETAL SCREEN: NEGATIVE
GESTATIONAL AGE(WKS): 40
Unit division: 0

## 2014-12-31 LAB — CBC
HCT: 25.7 % — ABNORMAL LOW (ref 36.0–46.0)
Hemoglobin: 8.1 g/dL — ABNORMAL LOW (ref 12.0–15.0)
MCH: 27.6 pg (ref 26.0–34.0)
MCHC: 31.5 g/dL (ref 30.0–36.0)
MCV: 87.4 fL (ref 78.0–100.0)
PLATELETS: 239 10*3/uL (ref 150–400)
RBC: 2.94 MIL/uL — AB (ref 3.87–5.11)
RDW: 14.3 % (ref 11.5–15.5)
WBC: 9.7 10*3/uL (ref 4.0–10.5)

## 2014-12-31 MED ORDER — IBUPROFEN 600 MG PO TABS: 600.0000 mg | ORAL_TABLET | Freq: Four times a day (QID) | ORAL | Status: AC

## 2014-12-31 MED ORDER — OXYCODONE-ACETAMINOPHEN 5-325 MG PO TABS: 1.0000 | ORAL_TABLET | ORAL | Status: DC | PRN

## 2014-12-31 NOTE — Lactation Note (Signed)
This note was copied from the chart of [place]. Lactation Consultation Note  Patient Name: Kim Pope [id number] Date: 12/31/2014 Reason for consult: Follow-up assessment Mother is pumping upon entering the room. She is expressing colostrum. (obtained 15 ml from each breast) She has only bottle fed the babies during the night and states they are tolerating bottle feeding well. They are getting expressed milk and formula. Advised to feed breast milk in the bottle first followed by formula. Mother reports that Baby A is not latching and pulls away from the breast when she attempts to latch her. Mother reports that Baby B is latching well for a few minutes then falls asleep. She has used the nipple shield with latching sometimes. Mother states she was feeling very overwhelmed with breastfeeding, pumping and supplementing along with being tired that she and her husband decided to only bottle last night. Mother is in good spirits, excited about going home and thrilled that her milk volume is increasing. Mother desires to feed the babies at breast. Plan for feeding given to mother: baby to breast with cues for feeding or nuzzle skin to skin, pump and give expressed milk by bottle, add formula to bottle after baby takes formula as needed. OP Lactation appointment scheduled for 01/09/2015 at 1 pm.Mom made aware of O/P services, breastfeeding support groups, community resources, and our phone # for post-discharge questions.   Maternal Data    Feeding Feeding Type: Bottle Fed - Breast Milk Nipple Type: Slow - flow  LATCH Score/Interventions                      Lactation Tools Discussed/Used     Consult Status Consult Status: Complete Date: 01/09/15 Follow-up type: Out-patient (OP Lactation Appointment)    Gwenevere Abbot 12/31/2014, 12:55 PM

## 2014-12-31 NOTE — Discharge Summary (Signed)
Obstetric Discharge Summary Reason for Admission: onset of labor Prenatal Procedures: ultrasound Intrapartum Procedures: cesarean: low cervical, transverse Postpartum Procedures: none Complications-Operative and Postpartum: none HEMOGLOBIN  Date Value Ref Range Status  12/31/2014 8.1* 12.0 - 15.0 g/dL Final   HCT  Date Value Ref Range Status  12/31/2014 25.7* 36.0 - 46.0 % Final    Physical Exam:  General: alert and cooperative Lochia: appropriate Uterine Fundus: firm Incision: healing well DVT Evaluation: No evidence of DVT seen on physical exam. Negative Homan's sign. No cords or calf tenderness. No significant calf/ankle edema.  Discharge Diagnoses: Term Pregnancy-delivered  Discharge Information: Date: 12/31/2014 Activity: pelvic rest Diet: routine Medications: PNV, Ibuprofen and Percocet Condition: stable Instructions: refer to practice specific booklet Discharge to: home   Newborn Data:   Zilda, Bomberger Bracey [id number]  Live born female  Birth Weight: 5 lb 13.7 oz (2655 g) APGAR: 8, [place], [place] [id number]  Live born female  Birth Weight: 5 lb 8 oz (2495 g) APGAR: 8, 9  Home with mother.  Jemia Fata G 12/31/2014, 9:02 AM

## 2015-01-01 LAB — TYPE AND SCREEN
ABO/RH(D): O NEG
Antibody Screen: POSITIVE
DAT, IgG: NEGATIVE
UNIT DIVISION: 0
Unit division: 0

## 2015-01-05 ENCOUNTER — Encounter (HOSPITAL_COMMUNITY): Payer: Self-pay | Admitting: Obstetrics & Gynecology

## 2015-01-08 ENCOUNTER — Other Ambulatory Visit (HOSPITAL_COMMUNITY): Payer: 59

## 2015-01-09 ENCOUNTER — Ambulatory Visit (HOSPITAL_COMMUNITY): Payer: 59

## 2015-01-21 ENCOUNTER — Ambulatory Visit (HOSPITAL_COMMUNITY)
Admission: RE | Admit: 2015-01-21 | Discharge: 2015-01-21 | Disposition: A | Discharge status: Home / Self Care | Payer: 59 | Source: Ambulatory Visit | Attending: Obstetrics & Gynecology | Admitting: Obstetrics & Gynecology

## 2015-01-21 ENCOUNTER — Ambulatory Visit (HOSPITAL_COMMUNITY)
Admission: RE | Admit: 2015-01-21 | Discharge: 2015-01-21 | Disposition: A | Discharge status: Home / Self Care | Payer: 59 | Source: Ambulatory Visit | Attending: Family Medicine | Admitting: Family Medicine

## 2015-01-21 NOTE — Lactation Note (Addendum)
Lactation Consult for Kim Pope Pope (mother) and Kim Pope Pope (DOB: [birth year])  Mother's reason for visit: "babies won't latch" Consult:  Initial Lactation Consultant:  Kim Pope Pope  ________________________________________________________________________ [facility]    Kim Pope Pope BW: [phone] (5# 8oz)   BW: [phone] (5# 13.7oz) 01-07-15: 6# 0oz   01-07-15: 6# 6oz Today's wt: 3308g (7# 4.7oz)  Today's wt: 3480g (7# 10.7oz) ________________________________________________________________________  Mother's Name: Kim Pope Pope Type of delivery:  C/S Breastfeeding Experience:  primip w/twins Maternal Medical Conditions:  None Maternal Medications: None  ________________________________________________________________________  Breastfeeding History (Post Discharge)  Frequency of breastfeeding: attempts made everyday Duration of feeding: not long  Babies receive 60-54mL of EBM q2h w/a Dr. Saul Pope bottle  Pumping  Type of pump:  Medela pump in style Frequency:  q2h Volume: 300 ml/session (in less than 15 min)  Infant Intake and Output Assessment  Voids: 10-12 in 24 hrs.  Color:  Clear yellow Stools: 10-12 in 24 hrs.  Color:  Yellow, grainy  ________________________________________________________________________  Maternal Breast Assessment  Breast:  Full Nipple:  Erect  _______________________________________________________________________ Feeding Assessment/Evaluation  Initial feeding assessment:  Infant's oral assessment:  WNL  Attached assessment:  Deep (Kim Pope Pope & Kim Pope)   Lips flanged:  Yes.  (both twins)   Suck assessment:  Displays both (Kim Pope Pope)  Tools:  Nipple shield 20 mm Instructed on use and cleaning of tool:  Yes.    Pre-feed weight: 3308 g (Kim Pope Pope)  Post-feed weight: 3382g Amount transferred: 74 ml (in 10-15min) L breast, football  Pre-feed weight: 3542 g (Kim Pope) Post-feed weight: 3594  g Amount transferred: 52 ml (in less than 10 min) R breast, football   Total amount transferred: 74 ml (Kim Pope Pope) & 52 mL (Kim Pope)  Mom presented for a consult b/c these babies (born as [name]) will not latch onto the breast. She attempts to latch them everyday. Babies have been exclusively bottle-fed since d/c from the hospital. Babies are well above BW at 91 weeks of age. Mom has no breast complaints.   A nipple shield (size 20) was applied & it was prefilled with a small amount of EBM (instilled via a curved-tip syringe). Kim Pope Pope latched w/relative ease and transferred 57mL in a short amount of time. Kim Pope Pope unlatched herself, satiated.   This was repeated w/Kim Pope, who also latched w/relative ease & transferred 52 mL in an even shorter amount of time. Kim Pope also unlatched herself, satiated.  Mom and [name] were very pleased w/the outcome.  Plan: 1. Use nipple shield when latching to breast. Mom can prefill NS prior to latch as needed. 2. Pump for comfort.  I have no concerns about these dyads.

## 2015-02-12 DIAGNOSIS — Z1389 Encounter for screening for other disorder: Secondary | ICD-10-CM | POA: Diagnosis not present

## 2015-03-06 IMAGING — US US ABDOMEN COMPLETE
1 series · 14 of 25 positions shown · non-contrast
Comparison: None.

CLINICAL DATA: Abdominal pain and weight loss.

EXAM:
ULTRASOUND ABDOMEN COMPLETE

[Series 1: us abdomen complete · 0.26mm/px · 14 of 85 slices shown]
[im 1/85]
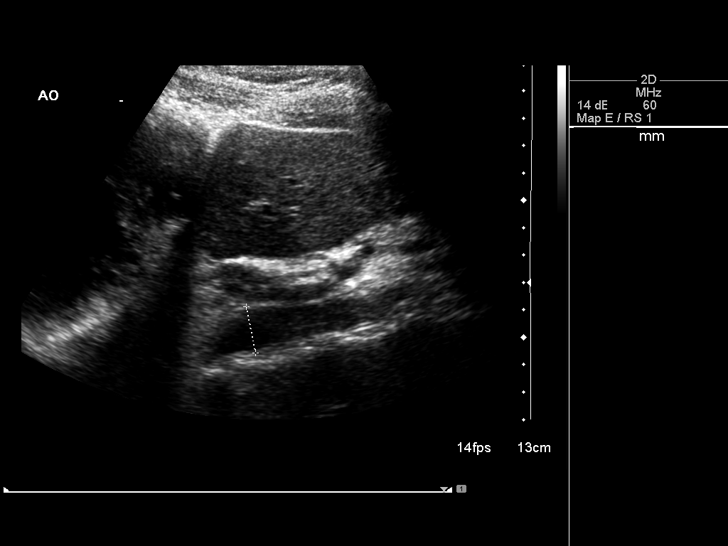
[im 8/85]
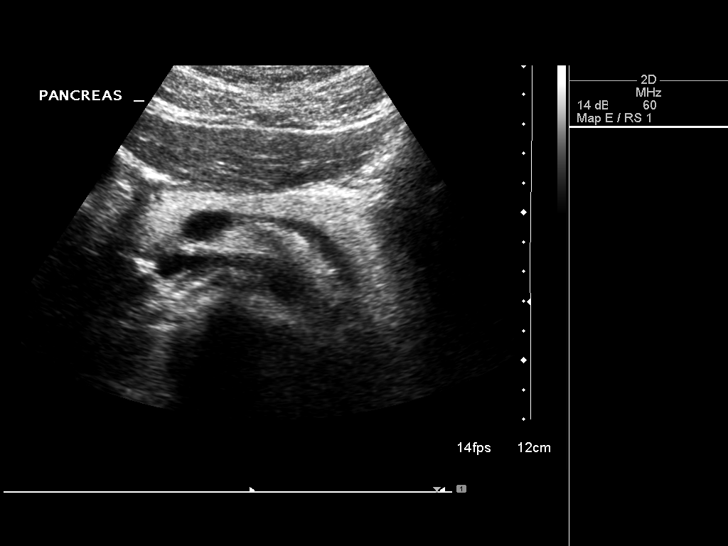
[im 15/85]
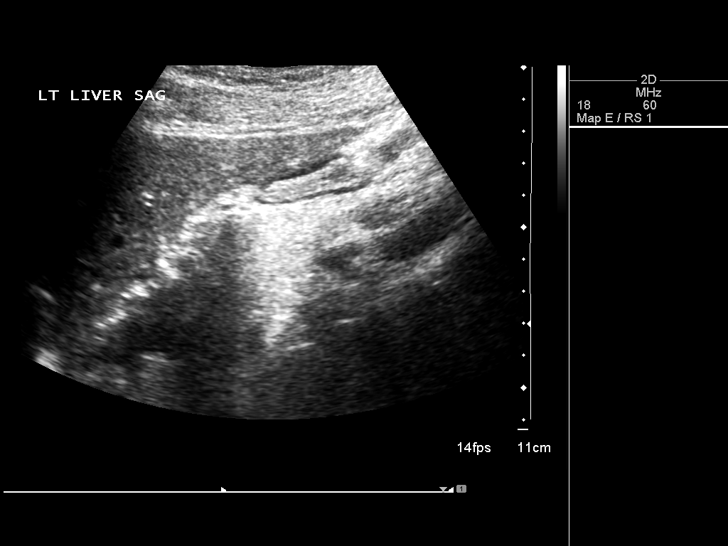
[im 22/85]
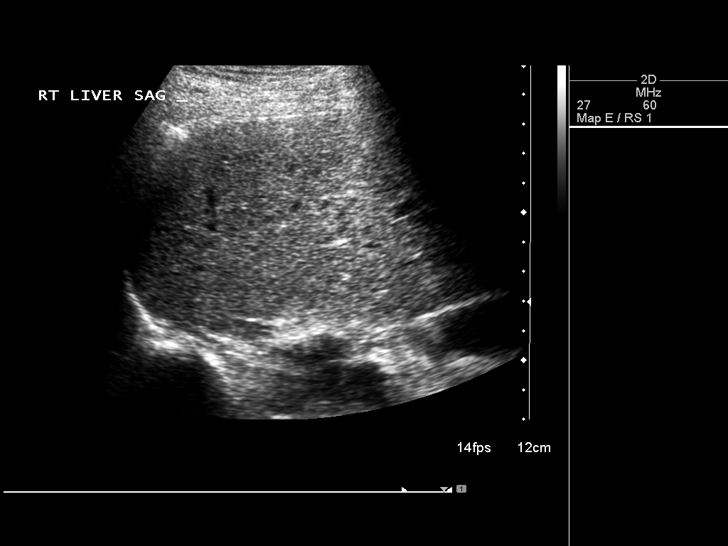
[im 29/85]
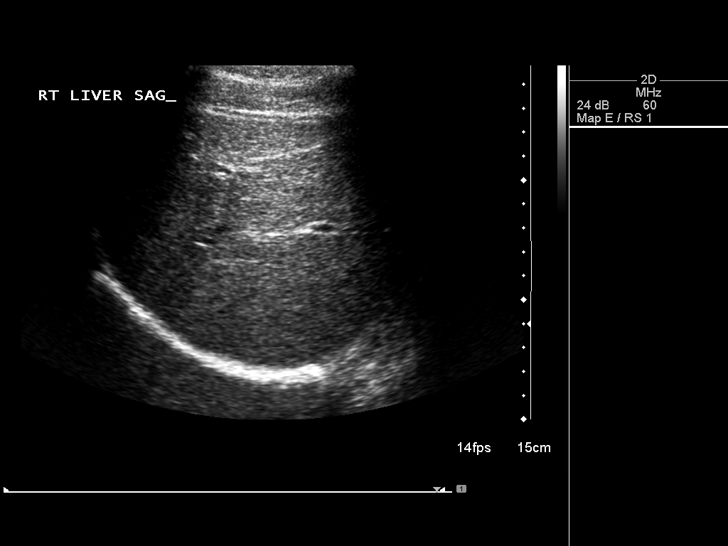
[im 32/85]
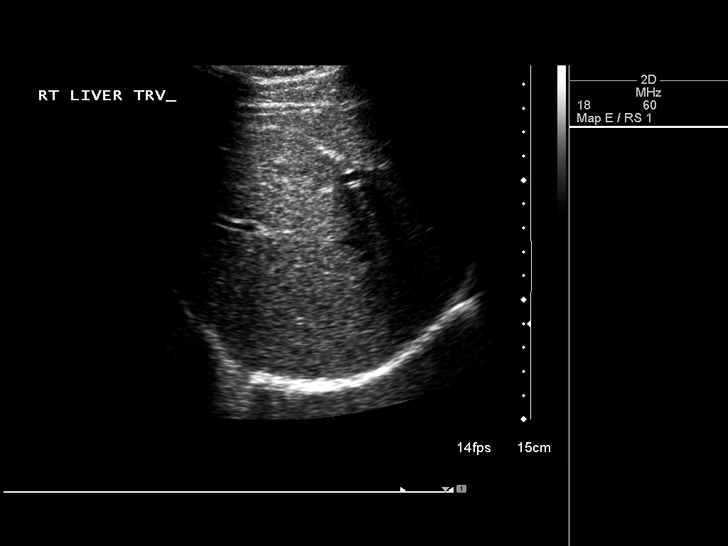
[im 39/85]
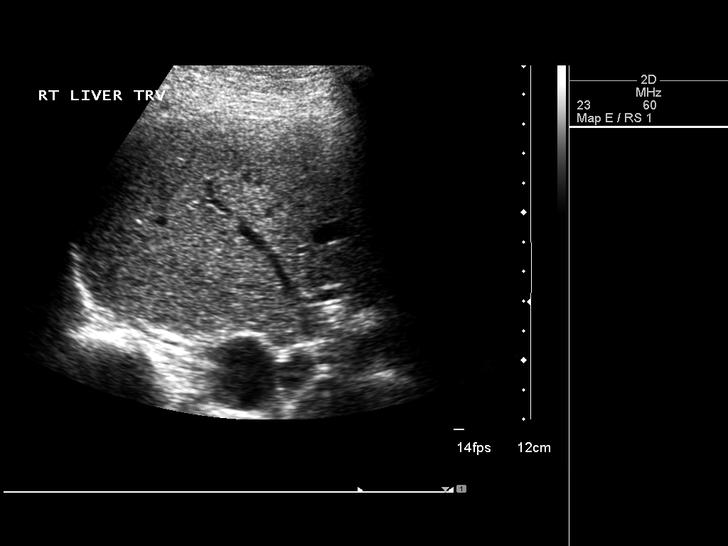
[im 46/85]
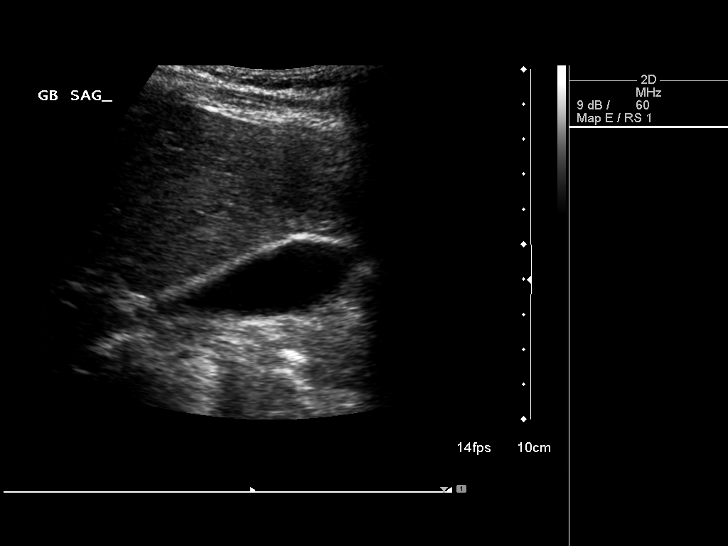
[im 53/85]
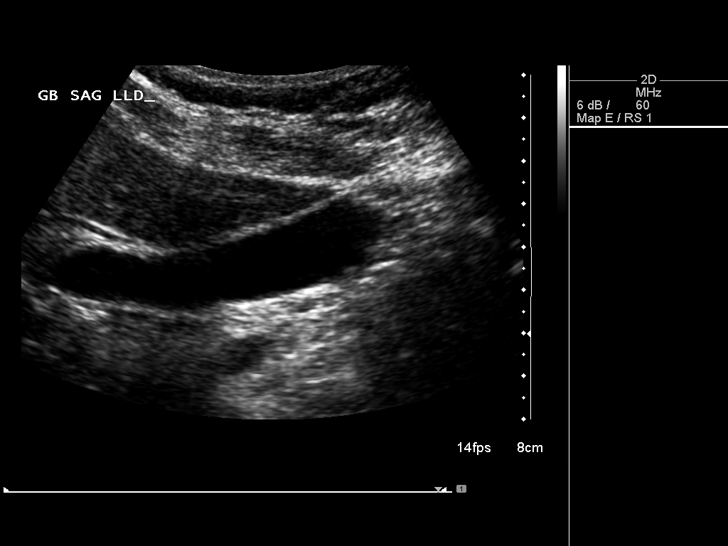
[im 57/85]
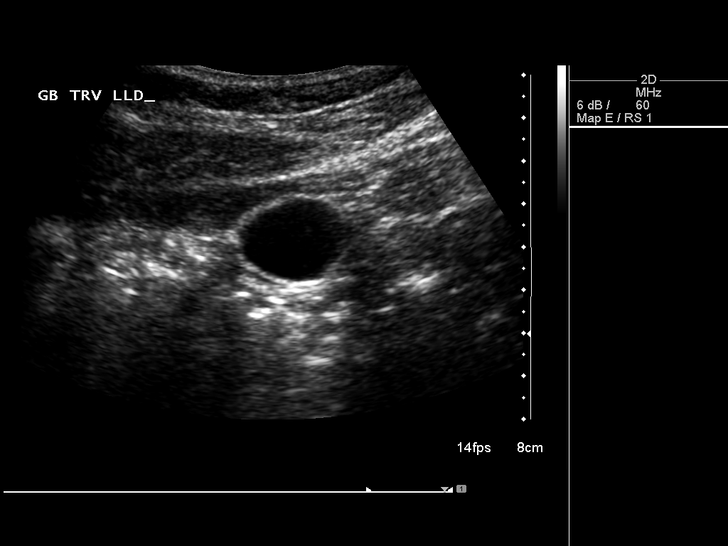
[im 64/85]
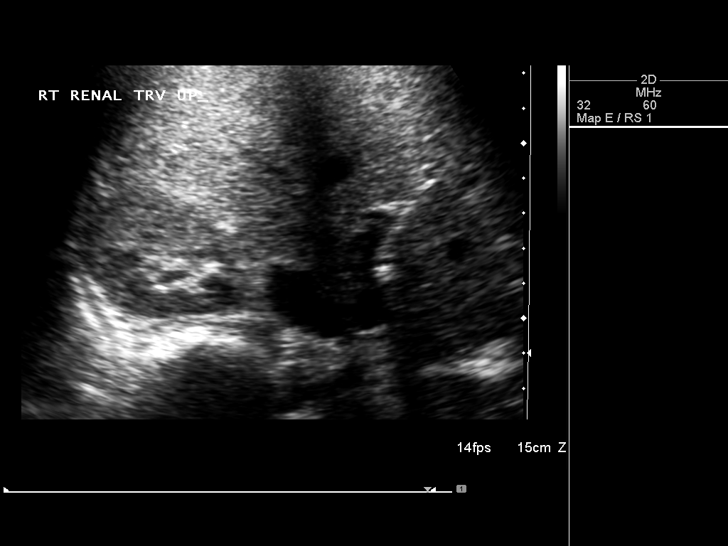
[im 71/85]
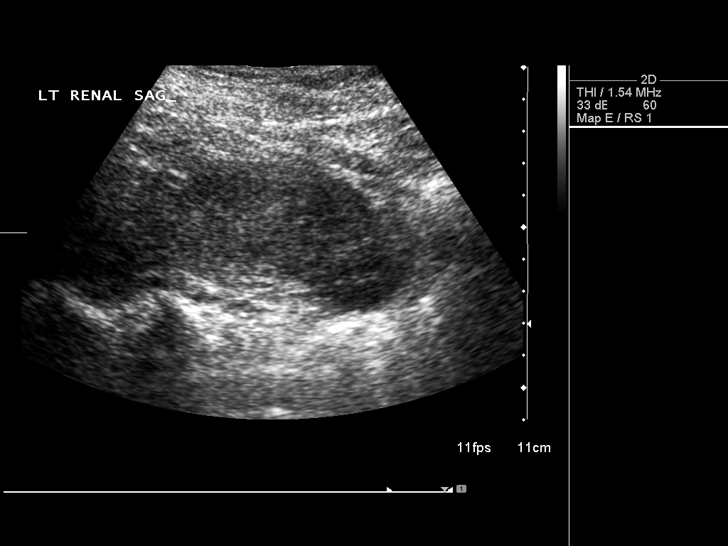
[im 78/85]
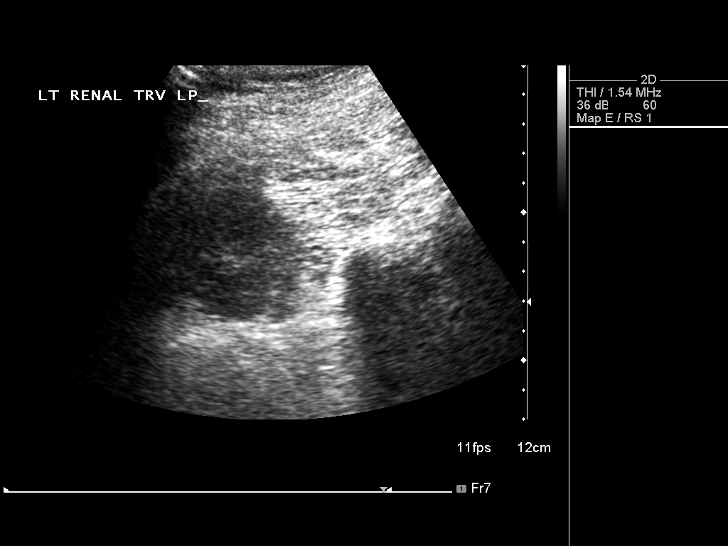
[im 85/85]
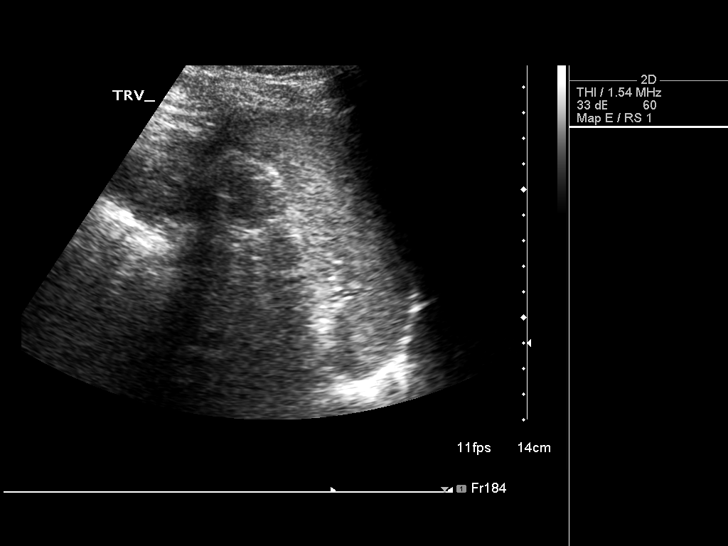

[14 of 25 positions shown; findings below may reference images not displayed]

FINDINGS: Gallbladder:

No gallstones or wall thickening visualized. No sonographic Murphy
sign noted.

Common bile duct:

Diameter: Nondilated at 5 mm diameter.

Liver:

No focal lesion identified. Within normal limits in parenchymal
echogenicity.

IVC:

No abnormality visualized.

Pancreas:

Visualized portion unremarkable.

Spleen:

Size and appearance within normal limits.

Right Kidney:

Length: 11.3 cm. Echogenicity within normal limits. No mass or
hydronephrosis visualized.

Left Kidney:

Length: 11.5 cm. Echogenicity within normal limits. No mass or
hydronephrosis visualized.

Abdominal aorta:

No aneurysm visualized.

Other findings:

None.
IMPRESSION: Normal abdominal ultrasound.

## 2016-05-25 DIAGNOSIS — F900 Attention-deficit hyperactivity disorder, predominantly inattentive type: Secondary | ICD-10-CM | POA: Diagnosis not present

## 2016-05-25 DIAGNOSIS — F411 Generalized anxiety disorder: Secondary | ICD-10-CM | POA: Diagnosis not present

## 2016-05-25 MED FILL — ESCITALOPRAM 10 MG TABLET: 10 | 30 days supply | Qty: 30 | Fill #0

## 2016-05-25 MED FILL — AMPHETAMINE SALTS 20 MG TAB: 20 | 30 days supply | Qty: 60 | Fill #0

## 2016-07-07 MED FILL — ESCITALOPRAM 10 MG TABLET: 10 | 30 days supply | Qty: 30 | Fill #1

## 2016-08-26 MED FILL — ESCITALOPRAM 10 MG TABLET: 10 | 30 days supply | Qty: 30 | Fill #2

## 2016-09-19 MED FILL — AMPHETAMINE SALTS 20 MG TAB: 20 | 30 days supply | Qty: 60 | Fill #0

## 2016-11-17 ENCOUNTER — Other Ambulatory Visit: Payer: Self-pay | Admitting: Family Medicine

## 2016-11-17 ENCOUNTER — Other Ambulatory Visit (HOSPITAL_COMMUNITY)
Admission: RE | Admit: 2016-11-17 | Discharge: 2016-11-17 | Disposition: A | Discharge status: Home / Self Care | Payer: 59 | Source: Ambulatory Visit | Attending: Family Medicine | Admitting: Family Medicine

## 2016-11-17 DIAGNOSIS — Z01411 Encounter for gynecological examination (general) (routine) with abnormal findings: Secondary | ICD-10-CM | POA: Insufficient documentation

## 2016-11-17 DIAGNOSIS — Z0000 Encounter for general adult medical examination without abnormal findings: Secondary | ICD-10-CM | POA: Diagnosis not present

## 2016-11-17 DIAGNOSIS — Z13220 Encounter for screening for lipoid disorders: Secondary | ICD-10-CM | POA: Diagnosis not present

## 2016-11-17 DIAGNOSIS — F900 Attention-deficit hyperactivity disorder, predominantly inattentive type: Secondary | ICD-10-CM | POA: Diagnosis not present

## 2016-11-17 DIAGNOSIS — Z23 Encounter for immunization: Secondary | ICD-10-CM | POA: Diagnosis not present

## 2016-11-17 DIAGNOSIS — N926 Irregular menstruation, unspecified: Secondary | ICD-10-CM | POA: Diagnosis not present

## 2016-11-17 DIAGNOSIS — F329 Major depressive disorder, single episode, unspecified: Secondary | ICD-10-CM | POA: Diagnosis not present

## 2016-11-17 MED FILL — BUPROPION HCL XL 150 MG TAB: 150 | 30 days supply | Qty: 60 | Fill #0

## 2016-11-21 LAB — CYTOLOGY - PAP: Diagnosis: NEGATIVE

## 2017-01-02 MED FILL — BUPROPION HCL XL 150 MG TAB: 150 | 30 days supply | Qty: 60 | Fill #1

## 2017-06-14 MED FILL — FLUTICASONE PROP 50 MCG SPR: 50 | 60 days supply | Qty: 16 | Fill #0

## 2017-10-23 MED FILL — DEXTROAMP-AMPHETAMIN 20 MG: 20 | 30 days supply | Qty: 60 | Fill #0

## 2017-10-23 MED FILL — buPROPion HCL ER (XL) 150 M: 150 | 30 days supply | Qty: 60 | Fill #0

## 2017-10-23 MED FILL — AZITHROMYCIN 250 MG TABLET: 250 | 5 days supply | Qty: 6 | Fill #0

## 2017-11-01 MED FILL — AMOX-CLAV 875-125 MG TABLET: 875-125 | 10 days supply | Qty: 20 | Fill #0

## 2017-11-27 MED FILL — buPROPion HCL ER (XL) 150 M: 150 | 30 days supply | Qty: 60 | Fill #1

## 2018-03-05 MED FILL — MEDROXYPROGESTERONE 10 MG T: 10 | 7 days supply | Qty: 7 | Fill #0

## 2018-04-09 MED FILL — AMOX-CLAV 875-125 MG TABLET: 875-125 | 10 days supply | Qty: 20 | Fill #0

## 2018-04-09 MED FILL — MONTELUKAST SOD 10 MG TAB: 10 | 30 days supply | Qty: 30 | Fill #0

## 2018-04-09 MED FILL — AZELASTINE HCL 137 MCG SPRY: 0.1 | 50 days supply | Qty: 30 | Fill #0

## 2018-04-09 MED FILL — DEXTROAMP-AMPHETAMIN 20 MG: 20 | 30 days supply | Qty: 60 | Fill #0

## 2018-08-22 ENCOUNTER — Ambulatory Visit
Admission: EM | Admit: 2018-08-22 | Discharge: 2018-08-22 | Disposition: A | Discharge status: Home / Self Care | Payer: No Typology Code available for payment source | Attending: Physician Assistant | Admitting: Physician Assistant

## 2018-08-22 DIAGNOSIS — M2550 Pain in unspecified joint: Secondary | ICD-10-CM

## 2018-08-22 DIAGNOSIS — F410 Panic disorder [episodic paroxysmal anxiety] without agoraphobia: Secondary | ICD-10-CM | POA: Diagnosis not present

## 2018-08-22 DIAGNOSIS — R0789 Other chest pain: Secondary | ICD-10-CM

## 2018-08-22 MED ORDER — MELOXICAM 7.5 MG PO TABS: 7.5000 mg | ORAL_TABLET | Freq: Every day | ORAL | 0 refills | Status: AC

## 2018-08-22 MED ORDER — AZELASTINE HCL 0.15 % NA SOLN: 1.0000 | Freq: Every day | NASAL | 0 refills | Status: AC

## 2018-08-22 MED FILL — MELOXICAM 7.5 MG TABLET: 7.5 | 14 days supply | Qty: 14 | Fill #0

## 2018-08-22 MED FILL — AZELASTINE 0.15% NASAL SPRY: 0.15 | 90 days supply | Qty: 30 | Fill #0

## 2018-08-22 NOTE — ED Provider Notes (Signed)
[facility]    CSN: [id number] Arrival date & time: 08/22/18  1606     History   Chief Complaint Chief Complaint  Patient presents with  . Chest Pain    HPI Kim KRETCHMER is a 33 y.o. female.   33 year old female comes in for right upper chest pain, joint pains, anxiety attack. States this started about 1-2 months ago with muscle aches/joint pains. She states family moved from a house to living in a basement. Husband has been having some allergy symptoms and worries for mold exposure. Paitent had some nasal congestion/pressure and started on a nasal spray. Otherwise no significant drainage, sore throat, cough. Today had a panic attack and caused some shortness of breath, otherwise, had not had shortness of breath. She noticed some right sided chest pain today and came in for evaluation. She has noticed less sleep, diarrhea. Denies abdominal pain, nausea/vomiting. Denies palpitations. Denies one sided leg swelling, long travels. Denies history of thyroid problems.      Past Medical History:  Diagnosis Date  . IBS (irritable bowel syndrome)     Patient Active Problem List   Diagnosis Date Noted  . S/P cesarean section 12/29/2014    Past Surgical History:  Procedure Laterality Date  . CESAREAN SECTION MULTI-GESTATIONAL N/A 12/28/2014   Procedure: CESAREAN SECTION MULTI-GESTATIONAL;  Surgeon: Linda Hedges, DO;  Location: [facility];  Service: Obstetrics;  Laterality: N/A;  Primary edc 01/22/15 NKDA ok to post at 2pm per Manon Hilding  . OVARIAN CYST REMOVAL Right   . WISDOM TOOTH EXTRACTION      OB History    Gravida  1   Para  1   Term      Preterm  1   AB      Living  2     SAB      TAB      Ectopic      Multiple  1   Live Births  2            Home Medications    Prior to Admission medications   Medication Sig Start Date End Date Taking? Authorizing Provider  Azelastine HCl 0.15 % SOLN Place 1 spray into the nose daily. 08/22/18    Tasia Catchings, Amy V, PA-C  ibuprofen (ADVIL,MOTRIN) 600 MG tablet Take 1 tablet (600 mg total) by mouth every 6 (six) hours. 12/31/14   Juanda Chance, NP  meloxicam (MOBIC) 7.5 MG tablet Take 1 tablet (7.5 mg total) by mouth daily. 08/22/18   Ok Edwards, PA-C    Family History Family History  Problem Relation Age of Onset  . Lymphoma Paternal Grandmother   . Diabetes Maternal Grandmother   . Irritable bowel syndrome Father   . GER disease Mother   . GER disease Maternal Aunt   . GER disease Maternal Grandfather     Social History Social History   Tobacco Use  . Smoking status: Never Smoker  . Smokeless tobacco: Never Used  Substance Use Topics  . Alcohol use: No  . Drug use: No     Allergies   Shellfish allergy   Review of Systems Review of Systems  Reason unable to perform ROS: See HPI as above.     Physical Exam Triage Vital Signs ED Triage Vitals  Enc Vitals Group     BP 08/22/18 1621 131/72     Pulse Rate 08/22/18 1621 (!) 101     Resp 08/22/18 1621 20  Temp 08/22/18 1621 98.3 F (36.8 C)     Temp Source 08/22/18 1621 Oral     SpO2 08/22/18 1621 98 %     Weight --      Height --      Head Circumference --      Peak Flow --      Pain Score 08/22/18 1622 6     Pain Loc --      Pain Edu? --      Excl. in [place]? --    No data found.  Updated Vital Signs BP 131/72 (BP Location: Left Arm)   Pulse (!) 101   Temp 98.3 F (36.8 C) (Oral)   Resp 20   LMP 07/29/2018   SpO2 98%   Physical Exam Constitutional:      General: She is not in acute distress.    Appearance: Normal appearance. She is well-developed. She is not ill-appearing, toxic-appearing or diaphoretic.  HENT:     Head: Normocephalic and atraumatic.     Mouth/Throat:     Mouth: Mucous membranes are moist.     Pharynx: Oropharynx is clear. Uvula midline.  Neck:     Musculoskeletal: Normal range of motion and neck supple.  Cardiovascular:     Rate and Rhythm: Normal rate and regular rhythm.      Heart sounds: Normal heart sounds. No murmur. No friction rub. No gallop.   Pulmonary:     Effort: Pulmonary effort is normal. No accessory muscle usage, prolonged expiration, respiratory distress or retractions.     Comments: Lungs clear to auscultation without adventitious lung sounds. Chest:     Chest wall: Tenderness (mild tenderness to right chest) present.  Abdominal:     General: Bowel sounds are normal.     Palpations: Abdomen is soft.     Tenderness: There is no right CVA tenderness, left CVA tenderness, guarding or rebound.  Neurological:     General: No focal deficit present.     Mental Status: She is alert and oriented to person, place, and time.    UC Treatments / Results  Labs (all labs ordered are listed, but only abnormal results are displayed) Labs Reviewed  TSH    EKG   Radiology No results found.  Procedures Procedures (including critical care time)  Medications Ordered in UC Medications - No data to display  Initial Impression / Assessment and Plan / UC Course  I have reviewed the triage vital signs and the nursing notes.  Pertinent labs & imaging results that were available during my care of the patient were reviewed by me and considered in my medical decision making (see chart for details).    No alarming signs on exam. Will draw TSH for further evaluation. Given chest pain is reproducible, will start mobic. Given husband works in healthcare, will swab for COVID, though lower suspicion given 1-2 month history of symptoms. Other symptomatic treatment discussed. Return precautions given. Patient expresses understanding and agrees to plan.  Final Clinical Impressions(s) / UC Diagnoses   Final diagnoses:  Right-sided chest wall pain  Multiple joint pain  Panic attack    ED Prescriptions    Medication Sig Dispense Auth. Provider   meloxicam (MOBIC) 7.5 MG tablet Take 1 tablet (7.5 mg total) by mouth daily. 14 tablet Yu, Amy V, PA-C   Azelastine  HCl 0.15 % SOLN Place 1 spray into the nose daily. 30 mL Tasia Catchings, Amy V, PA-C        Yu, Amy V,  PA-C 08/22/18 1700

## 2018-08-22 NOTE — Discharge Instructions (Addendum)
No alarming signs. As discuss, may need further workup to make sure symptoms are not related to any chronic disease such as autoimmune conditions. We are checking your TSH level to make sure thyroid is not related. Mobic for muscle/joint pain at this time. COVID testing ordered. If symptoms continue, to follow up with PCP for further evaluation needed. If sudden worsening chest pain, shortness of breath, one sided leg swelling, go to the ED for further evaluation needed.

## 2018-08-22 NOTE — ED Triage Notes (Signed)
Pt c/o upper chest pain, pain to joints, anxiety attack today causing SOB. States living in a basement of her husbands grandmothers house and thinks they have been exposed to mold. States this has been going on off and on for the past month. States been their for 93months

## 2018-08-23 LAB — TSH: TSH: 0.621 u[IU]/mL (ref 0.450–4.500)

## 2018-08-24 ENCOUNTER — Telehealth (HOSPITAL_COMMUNITY): Payer: Self-pay | Admitting: Emergency Medicine

## 2018-08-24 NOTE — Telephone Encounter (Signed)
Normal. Attempted to reach patient. No answer at this time.

## 2018-08-26 LAB — NOVEL CORONAVIRUS, NAA: SARS-CoV-2, NAA: NOT DETECTED

## 2018-08-27 ENCOUNTER — Encounter (HOSPITAL_COMMUNITY): Payer: Self-pay

## 2018-12-21 MED FILL — AZELASTINE HCL 137 MCG SPRY: 0.1 | 50 days supply | Qty: 30 | Fill #0

## 2018-12-21 MED FILL — ADDERALL XR 20 MG CAP SA: 20 | 30 days supply | Qty: 30 | Fill #0

## 2019-03-20 MED FILL — ADDERALL XR 20 MG CAP SA: 20 | 30 days supply | Qty: 30 | Fill #0

## 2019-05-03 MED FILL — AZELASTINE HCL 137 MCG SPRY: 0.1 | 50 days supply | Qty: 30 | Fill #1

## 2019-05-27 MED FILL — DEXTROAMP-AMPHETAMIN 20 MG: 20 | 30 days supply | Qty: 60 | Fill #0

## 2020-04-30 ENCOUNTER — Other Ambulatory Visit (HOSPITAL_COMMUNITY): Payer: Self-pay | Admitting: Medical

## 2020-04-30 MED FILL — TRIAMCINOLONE 0.1% OINTMENT: 0.1 | 30 days supply | Qty: 60 | Fill #0

## 2020-04-30 MED FILL — hydrOXYzine HCL 25 MG TABS: 25 | 60 days supply | Qty: 60 | Fill #0

## 2020-11-16 ENCOUNTER — Other Ambulatory Visit (HOSPITAL_COMMUNITY): Payer: Self-pay

## 2020-11-16 MED ORDER — SULFACETAMIDE SODIUM 10 % OP SOLN
1.0000 [drp] | Freq: Four times a day (QID) | OPHTHALMIC | 0 refills | Status: AC
  Filled 2020-11-16: qty 15, 5d supply, fill #0

## 2021-01-12 ENCOUNTER — Other Ambulatory Visit (HOSPITAL_COMMUNITY): Payer: Self-pay

## 2021-01-12 MED ORDER — TRIAMCINOLONE ACETONIDE 0.1 % EX OINT
1.0000 "application " | TOPICAL_OINTMENT | Freq: Two times a day (BID) | CUTANEOUS | 0 refills | Status: AC
  Filled 2021-01-12: qty 45, 14d supply, fill #0

## 2021-02-23 ENCOUNTER — Other Ambulatory Visit (HOSPITAL_COMMUNITY): Payer: Self-pay

## 2021-02-23 MED ORDER — AMPHETAMINE-DEXTROAMPHETAMINE 10 MG PO TABS
20.0000 mg | ORAL_TABLET | Freq: Every day | ORAL | 0 refills | Status: AC
  Filled 2021-02-23: qty 60, 30d supply, fill #0

## 2021-02-23 MED ORDER — AMPHETAMINE-DEXTROAMPHETAMINE 20 MG PO TABS
20.0000 mg | ORAL_TABLET | Freq: Every day | ORAL | 0 refills | Status: DC
  Filled 2021-02-23: qty 30, 30d supply, fill #0

## 2021-04-13 ENCOUNTER — Other Ambulatory Visit (HOSPITAL_COMMUNITY): Payer: Self-pay

## 2021-07-21 ENCOUNTER — Other Ambulatory Visit (HOSPITAL_COMMUNITY): Payer: Self-pay

## 2021-07-21 MED ORDER — MUPIROCIN 2 % EX OINT
1.0000 "application " | TOPICAL_OINTMENT | Freq: Two times a day (BID) | CUTANEOUS | 2 refills | Status: AC
  Filled 2021-07-21: qty 44, 30d supply, fill #0

## 2021-07-21 MED ORDER — CLOBETASOL PROPIONATE 0.05 % EX OINT
1.0000 "application " | TOPICAL_OINTMENT | Freq: Two times a day (BID) | CUTANEOUS | 2 refills | Status: AC
  Filled 2021-07-21: qty 45, 30d supply, fill #0

## 2021-08-18 ENCOUNTER — Other Ambulatory Visit (HOSPITAL_COMMUNITY): Payer: Self-pay

## 2021-08-18 MED ORDER — DROSPIRENONE-ETHINYL ESTRADIOL 3-0.02 MG PO TABS
1.0000 | ORAL_TABLET | Freq: Every day | ORAL | 3 refills | Status: AC
  Filled 2021-08-18: qty 84, 84d supply, fill #0

## 2021-08-18 MED ORDER — SERTRALINE HCL 25 MG PO TABS
25.0000 mg | ORAL_TABLET | Freq: Every day | ORAL | 1 refills | Status: DC
  Filled 2021-08-18: qty 30, 30d supply, fill #0
  Filled 2021-09-20: qty 30, 30d supply, fill #1

## 2021-08-26 ENCOUNTER — Other Ambulatory Visit (HOSPITAL_COMMUNITY): Payer: Self-pay

## 2021-08-26 MED ORDER — EPINEPHRINE 0.3 MG/0.3ML IJ SOAJ
0.3000 mg | INTRAMUSCULAR | 0 refills | Status: AC | PRN
  Filled 2021-08-26: qty 2, 2d supply, fill #0

## 2021-08-26 MED ORDER — DICYCLOMINE HCL 20 MG PO TABS
20.0000 mg | ORAL_TABLET | Freq: Three times a day (TID) | ORAL | 0 refills | Status: AC | PRN
  Filled 2021-08-26: qty 90, 30d supply, fill #0

## 2021-09-03 ENCOUNTER — Other Ambulatory Visit (HOSPITAL_COMMUNITY): Payer: Self-pay

## 2021-09-20 ENCOUNTER — Other Ambulatory Visit (HOSPITAL_COMMUNITY): Payer: Self-pay

## 2021-09-29 ENCOUNTER — Other Ambulatory Visit (HOSPITAL_COMMUNITY): Payer: Self-pay

## 2021-09-29 MED ORDER — SERTRALINE HCL 50 MG PO TABS
50.0000 mg | ORAL_TABLET | Freq: Every day | ORAL | 1 refills | Status: AC
  Filled 2021-09-29: qty 30, 30d supply, fill #0

## 2021-10-21 ENCOUNTER — Other Ambulatory Visit (HOSPITAL_COMMUNITY): Payer: Self-pay

## 2021-10-21 MED ORDER — MUPIROCIN 2 % EX OINT
1.0000 | TOPICAL_OINTMENT | Freq: Two times a day (BID) | CUTANEOUS | 1 refills | Status: AC
  Filled 2021-10-21: qty 44, 22d supply, fill #0

## 2021-10-21 MED ORDER — CLOBETASOL PROPIONATE 0.05 % EX LOTN
1.0000 "application " | TOPICAL_LOTION | Freq: Every morning | CUTANEOUS | 1 refills | Status: AC
  Filled 2021-10-21: qty 59, 30d supply, fill #0

## 2021-10-21 MED ORDER — CLOBETASOL PROPIONATE 0.05 % EX OINT
1.0000 | TOPICAL_OINTMENT | Freq: Every evening | CUTANEOUS | 1 refills | Status: AC
  Filled 2021-10-21: qty 45, 30d supply, fill #0

## 2021-10-26 ENCOUNTER — Other Ambulatory Visit (HOSPITAL_COMMUNITY): Payer: Self-pay

## 2021-11-18 ENCOUNTER — Other Ambulatory Visit (HOSPITAL_COMMUNITY): Payer: Self-pay

## 2021-11-18 ENCOUNTER — Other Ambulatory Visit: Payer: Self-pay | Admitting: Nurse Practitioner

## 2021-11-18 DIAGNOSIS — N644 Mastodynia: Secondary | ICD-10-CM

## 2021-11-18 MED ORDER — ESCITALOPRAM OXALATE 5 MG PO TABS
ORAL_TABLET | ORAL | 0 refills | Status: AC
  Filled 2021-11-18: qty 60, 30d supply, fill #0

## 2021-12-29 ENCOUNTER — Ambulatory Visit
Admission: RE | Admit: 2021-12-29 | Discharge: 2021-12-29 | Disposition: A | Discharge status: Home / Self Care | Payer: No Typology Code available for payment source | Source: Ambulatory Visit | Attending: Nurse Practitioner | Admitting: Nurse Practitioner

## 2021-12-29 DIAGNOSIS — N644 Mastodynia: Secondary | ICD-10-CM

## 2022-01-14 ENCOUNTER — Other Ambulatory Visit (HOSPITAL_COMMUNITY): Payer: Self-pay

## 2022-01-14 MED ORDER — AMOXICILLIN-POT CLAVULANATE 875-125 MG PO TABS
1.0000 | ORAL_TABLET | Freq: Two times a day (BID) | ORAL | 0 refills | Status: AC
  Filled 2022-01-14: qty 20, 10d supply, fill #0

## 2022-03-09 ENCOUNTER — Other Ambulatory Visit (HOSPITAL_COMMUNITY): Payer: Self-pay

## 2022-03-09 DIAGNOSIS — F900 Attention-deficit hyperactivity disorder, predominantly inattentive type: Secondary | ICD-10-CM | POA: Diagnosis not present

## 2022-03-09 DIAGNOSIS — E559 Vitamin D deficiency, unspecified: Secondary | ICD-10-CM | POA: Diagnosis not present

## 2022-03-09 DIAGNOSIS — Z79899 Other long term (current) drug therapy: Secondary | ICD-10-CM | POA: Diagnosis not present

## 2022-03-09 DIAGNOSIS — E785 Hyperlipidemia, unspecified: Secondary | ICD-10-CM | POA: Diagnosis not present

## 2022-03-09 DIAGNOSIS — E282 Polycystic ovarian syndrome: Secondary | ICD-10-CM | POA: Diagnosis not present

## 2022-03-09 DIAGNOSIS — Z0000 Encounter for general adult medical examination without abnormal findings: Secondary | ICD-10-CM | POA: Diagnosis not present

## 2022-03-09 DIAGNOSIS — K580 Irritable bowel syndrome with diarrhea: Secondary | ICD-10-CM | POA: Diagnosis not present

## 2022-03-09 DIAGNOSIS — F411 Generalized anxiety disorder: Secondary | ICD-10-CM | POA: Diagnosis not present

## 2022-03-09 MED ORDER — AMPHETAMINE-DEXTROAMPHETAMINE 20 MG PO TABS
20.0000 mg | ORAL_TABLET | Freq: Two times a day (BID) | ORAL | 0 refills | Status: AC
  Filled 2022-03-09: qty 60, 30d supply, fill #0

## 2022-03-09 MED ORDER — AMPHETAMINE-DEXTROAMPHETAMINE 20 MG PO TABS: 20.0000 mg | ORAL_TABLET | Freq: Two times a day (BID) | ORAL | 0 refills | Status: AC

## 2022-08-15 ENCOUNTER — Other Ambulatory Visit (HOSPITAL_COMMUNITY): Payer: Self-pay

## 2022-08-15 DIAGNOSIS — R112 Nausea with vomiting, unspecified: Secondary | ICD-10-CM | POA: Diagnosis not present

## 2022-08-15 DIAGNOSIS — R197 Diarrhea, unspecified: Secondary | ICD-10-CM | POA: Diagnosis not present

## 2022-08-15 MED ORDER — ONDANSETRON 4 MG PO TBDP
4.0000 mg | ORAL_TABLET | Freq: Three times a day (TID) | ORAL | 0 refills | Status: AC
  Filled 2022-08-15: qty 9, 3d supply, fill #0

## 2022-08-16 DIAGNOSIS — R197 Diarrhea, unspecified: Secondary | ICD-10-CM | POA: Diagnosis not present

## 2022-08-17 ENCOUNTER — Ambulatory Visit
Admission: EM | Admit: 2022-08-17 | Discharge: 2022-08-17 | Disposition: A | Discharge status: Home / Self Care | Payer: 59 | Attending: Family Medicine | Admitting: Family Medicine

## 2022-08-17 DIAGNOSIS — M5441 Lumbago with sciatica, right side: Secondary | ICD-10-CM | POA: Diagnosis not present

## 2022-08-17 MED ORDER — PREDNISONE 20 MG PO TABS: 40.0000 mg | ORAL_TABLET | Freq: Every day | ORAL | 0 refills | Status: AC

## 2022-08-17 MED ORDER — CYCLOBENZAPRINE HCL 10 MG PO TABS: 10.0000 mg | ORAL_TABLET | Freq: Two times a day (BID) | ORAL | 0 refills | Status: AC | PRN

## 2022-08-17 NOTE — Discharge Instructions (Addendum)
Take medication as prescribed.  Recommend applying heat applications to your lower back.  Encourage you to minimize sitting for greater than an hour without standing as this can cause your back pain to worsen therefore change positions frequently while awake.  With the prednisone do not take any naproxen or ibuprofen as it will call the prednisone to be ineffective.  Take prednisone with food to avoid upset stomach.

## 2022-08-17 NOTE — ED Triage Notes (Signed)
Pt reports she has pulled her right side  lower back after taking a step she heard her back made a popping sound x 3 days. Took dual tylenol and advil and muscle relaxer.

## 2022-08-17 NOTE — ED Provider Notes (Signed)
[facility]    CSN: [id number] Arrival date & time: 08/17/22  0818      History   Chief Complaint No chief complaint on file.   HPI Kim Pope is a 37 y.o. female.   HPI Patient is today with right lower back pain.  Patient reports 3 days ago she stepped in a manner in which she heard a "pop" in her lower back.  She has continued to have back pain since this incident occurred 3 days ago and she has been taking Tylenol/Advil along with a muscle relaxer without relief.  He denies any UTI symptoms.  Past Medical History:  Diagnosis Date   IBS (irritable bowel syndrome)     Patient Active Problem List   Diagnosis Date Noted   S/P cesarean section 12/29/2014    Past Surgical History:  Procedure Laterality Date   BREAST BIOPSY Right 2013   CESAREAN SECTION MULTI-GESTATIONAL N/A 12/28/2014   Procedure: CESAREAN SECTION MULTI-GESTATIONAL;  Surgeon: Mitchel Honour, DO;  Location: [facility];  Service: Obstetrics;  Laterality: N/A;  Primary edc 01/22/15 NKDA ok to post at 2pm per Marrion Coy   OVARIAN CYST REMOVAL Right    WISDOM TOOTH EXTRACTION      OB History     Gravida  1   Para  1   Term      Preterm  1   AB      Living  2      SAB      IAB      Ectopic      Multiple  1   Live Births  2            Home Medications    Prior to Admission medications   Medication Sig Start Date End Date Taking? Authorizing Provider  cyclobenzaprine (FLEXERIL) 10 MG tablet Take 1 tablet (10 mg total) by mouth 2 (two) times daily as needed for muscle spasms. 08/17/22  Yes Bing Neighbors, NP  predniSONE (DELTASONE) 20 MG tablet Take 2 tablets (40 mg total) by mouth daily with breakfast for 5 days. 08/17/22 08/22/22 Yes Bing Neighbors, NP  amphetamine-dextroamphetamine (ADDERALL) 10 MG tablet Take 2 tablets (20 mg total) by mouth daily. 02/23/21     amphetamine-dextroamphetamine (ADDERALL) 20 MG tablet Take 1 tablet (20 mg total) by mouth 2 (two)  times daily. 04/07/22     amphetamine-dextroamphetamine (ADDERALL) 20 MG tablet Take 1 tablet (20 mg total) by mouth 2 (two) times daily. 05/06/22     amphetamine-dextroamphetamine (ADDERALL) 20 MG tablet Take 1 tablet (20 mg total) by mouth 2 (two) times daily. 03/09/22     Azelastine HCl 0.15 % SOLN Place 1 spray into the nose daily. 08/22/18   Cathie Hoops, Amy V, PA-C  clobetasol ointment (TEMOVATE) 0.05 % Apply 1 application  topically to hands 2 (two) times daily. 07/21/21     clobetasol ointment (TEMOVATE) 0.05 % Apply 1 application topically every evening for 30 days 10/21/21     Clobetasol Propionate 0.05 % lotion Apply 1 application  topically in the morning for 30 days 10/21/21     dicyclomine (BENTYL) 20 MG tablet Take 1 tablet (20 mg total) by mouth 3 (three) times daily as needed. 08/26/21     drospirenone-ethinyl estradiol (YAZ) 3-0.02 MG tablet Take 1 tablet by mouth daily. 08/18/21     EPINEPHrine (AUVI-Q) 0.3 mg/0.3 mL IJ SOAJ injection Inject 0.3 mg into the muscle as needed as directed 08/26/21  escitalopram (LEXAPRO) 5 MG tablet Take 1 tablet (5 mg total) by mouth daily for 7 days, if tolerating well can increase to 2 tablets (10 mg total) daily 11/18/21 12/18/21    ibuprofen (ADVIL,MOTRIN) 600 MG tablet Take 1 tablet (600 mg total) by mouth every 6 (six) hours. 12/31/14   Julio Sicks, NP  meloxicam (MOBIC) 7.5 MG tablet Take 1 tablet (7.5 mg total) by mouth daily. 08/22/18   Cathie Hoops, Amy V, PA-C  mupirocin ointment (BACTROBAN) 2 % Apply 1 application  topically 2 (two) times daily. 07/21/21     mupirocin ointment (BACTROBAN) 2 % Apply 1 application topically 2 (two) times daily. 10/21/21     ondansetron (ZOFRAN-ODT) 4 MG disintegrating tablet dissolve 1 tablet (4 mg total) in mouth 3 (three) times daily. 08/15/22     sertraline (ZOLOFT) 50 MG tablet Take 1 tablet (50 mg total) by mouth daily. 09/29/21     sulfacetamide (BLEPH-10) 10 % ophthalmic solution Instill 1 drop into affected eye every 6 hours  for 5 days 11/16/20     triamcinolone ointment (KENALOG) 0.1 % Apply 1 application topically 2 (two) times daily for 14 days. 01/12/21       Family History Family History  Problem Relation Age of Onset   Lymphoma Paternal Grandmother    Diabetes Maternal Grandmother    Irritable bowel syndrome Father    GER disease Mother    GER disease Maternal Aunt    GER disease Maternal Grandfather     Social History Social History   Tobacco Use   Smoking status: Never   Smokeless tobacco: Never  Substance Use Topics   Alcohol use: No   Drug use: No     Allergies   Shellfish allergy   Review of Systems Review of Systems Pertinent negatives listed in HPI   Physical Exam Triage Vital Signs ED Triage Vitals  Enc Vitals Group     BP 08/17/22 0937 107/67     Pulse Rate 08/17/22 0937 69     Resp 08/17/22 0937 18     Temp 08/17/22 0937 98.1 F (36.7 C)     Temp Source 08/17/22 0937 Oral     SpO2 08/17/22 0937 98 %     Weight --      Height --      Head Circumference --      Peak Flow --      Pain Score 08/17/22 0936 8     Pain Loc --      Pain Edu? --      Excl. in GC? --    No data found.  Updated Vital Signs BP 107/67 (BP Location: Left Arm)   Pulse 69   Temp 98.1 F (36.7 C) (Oral)   Resp 18   LMP 08/04/2022   SpO2 98%   Visual Acuity Right Eye Distance:   Left Eye Distance:   Bilateral Distance:    Right Eye Near:   Left Eye Near:    Bilateral Near:     Physical Exam Vitals reviewed.  Constitutional:      Appearance: Normal appearance.  HENT:     Nose: Nose normal.  Eyes:     Extraocular Movements: Extraocular movements intact.     Conjunctiva/sclera: Conjunctivae normal.     Pupils: Pupils are equal, round, and reactive to light.  Cardiovascular:     Rate and Rhythm: Normal rate and regular rhythm.  Pulmonary:     Effort: Pulmonary effort is normal.  Breath sounds: Normal breath sounds.  Musculoskeletal:     Cervical back: Normal range of  motion. No rigidity.     Lumbar back: Tenderness present. No swelling, deformity, lacerations or spasms. Positive right straight leg raise test. Negative left straight leg raise test.       Back:     Comments: Visible deformity or swelling or mass palpable on lower lumbar spine  Lymphadenopathy:     Cervical: No cervical adenopathy.  Neurological:     Mental Status: She is alert.      UC Treatments / Results  Labs (all labs ordered are listed, but only abnormal results are displayed) Labs Reviewed - No data to display   EKG   Radiology No results found.  Procedures Procedures (including critical care time)  Medications Ordered in UC Medications - No data to display  Initial Impression / Assessment and Plan / UC Course  I have reviewed the triage vital signs and the nursing notes.  Pertinent labs & imaging results that were available during my care of the patient were reviewed by me and considered in my medical decision making (see chart for details).    Cute low back pain with right-sided sciatica, treatment with prednisone 40 mg once daily for 5 days. Cyclobenzaprine can be taken up to twice daily as needed for back pain.  Also recommended heat applications to help reduce inflammation. Return precautions given if symptoms worsen or do not improve. Final Clinical Impressions(s) / UC Diagnoses   Final diagnoses:  Acute bilateral low back pain with right-sided sciatica     Discharge Instructions      Take medic patient as prescribed.  Recommend applying heat applications to your lower back.  Encourage you to minimize sitting for greater than an hour without standing as this can cause your back pain to worsen therefore change positions frequently while awake.  With the prednisone do not take any naproxen or ibuprofen as it will call the prednisone to be ineffective.  Take prednisone with food to avoid upset stomach.     ED Prescriptions     Medication Sig  Dispense Auth. Provider   cyclobenzaprine (FLEXERIL) 10 MG tablet Take 1 tablet (10 mg total) by mouth 2 (two) times daily as needed for muscle spasms. 20 tablet Bing Neighbors, NP   predniSONE (DELTASONE) 20 MG tablet Take 2 tablets (40 mg total) by mouth daily with breakfast for 5 days. 10 tablet Bing Neighbors, NP      PDMP not reviewed this encounter.   Bing Neighbors, NP 08/17/22 [phone]

## 2022-08-23 ENCOUNTER — Other Ambulatory Visit (HOSPITAL_COMMUNITY): Payer: Self-pay

## 2022-08-23 DIAGNOSIS — Z8742 Personal history of other diseases of the female genital tract: Secondary | ICD-10-CM | POA: Diagnosis not present

## 2022-08-23 DIAGNOSIS — R202 Paresthesia of skin: Secondary | ICD-10-CM | POA: Diagnosis not present

## 2022-08-23 DIAGNOSIS — M25551 Pain in right hip: Secondary | ICD-10-CM | POA: Diagnosis not present

## 2022-08-23 DIAGNOSIS — M545 Low back pain, unspecified: Secondary | ICD-10-CM | POA: Diagnosis not present

## 2022-08-23 MED ORDER — NAPROXEN 500 MG PO TABS
500.0000 mg | ORAL_TABLET | Freq: Two times a day (BID) | ORAL | 0 refills | Status: AC
  Filled 2022-08-23: qty 30, 15d supply, fill #0

## 2022-08-24 ENCOUNTER — Other Ambulatory Visit (HOSPITAL_COMMUNITY): Payer: Self-pay

## 2022-08-24 DIAGNOSIS — N943 Premenstrual tension syndrome: Secondary | ICD-10-CM | POA: Diagnosis not present

## 2022-08-24 DIAGNOSIS — R102 Pelvic and perineal pain: Secondary | ICD-10-CM | POA: Diagnosis not present

## 2022-08-24 DIAGNOSIS — E282 Polycystic ovarian syndrome: Secondary | ICD-10-CM | POA: Diagnosis not present

## 2022-08-24 DIAGNOSIS — R198 Other specified symptoms and signs involving the digestive system and abdomen: Secondary | ICD-10-CM | POA: Diagnosis not present

## 2022-08-24 DIAGNOSIS — N898 Other specified noninflammatory disorders of vagina: Secondary | ICD-10-CM | POA: Diagnosis not present

## 2022-08-24 MED ORDER — OMEPRAZOLE 20 MG PO CPDR
20.0000 mg | DELAYED_RELEASE_CAPSULE | Freq: Every day | ORAL | 0 refills | Status: DC
  Filled 2022-08-24: qty 30, 30d supply, fill #0

## 2022-08-24 MED ORDER — DROSPIRENONE-ETHINYL ESTRADIOL 3-0.02 MG PO TABS
1.0000 | ORAL_TABLET | Freq: Every day | ORAL | 4 refills | Status: AC
  Filled 2022-08-24: qty 84, 84d supply, fill #0

## 2022-08-25 ENCOUNTER — Other Ambulatory Visit (HOSPITAL_COMMUNITY): Payer: Self-pay | Admitting: Family Medicine

## 2022-08-25 DIAGNOSIS — M545 Low back pain, unspecified: Secondary | ICD-10-CM

## 2022-08-26 ENCOUNTER — Ambulatory Visit (HOSPITAL_COMMUNITY)
Admission: RE | Admit: 2022-08-26 | Discharge: 2022-08-26 | Disposition: A | Discharge status: Home / Self Care | Payer: 59 | Source: Ambulatory Visit | Attending: Family Medicine | Admitting: Family Medicine

## 2022-08-26 DIAGNOSIS — M5126 Other intervertebral disc displacement, lumbar region: Secondary | ICD-10-CM | POA: Diagnosis not present

## 2022-08-26 DIAGNOSIS — M47816 Spondylosis without myelopathy or radiculopathy, lumbar region: Secondary | ICD-10-CM | POA: Diagnosis not present

## 2022-08-26 DIAGNOSIS — M545 Low back pain, unspecified: Secondary | ICD-10-CM | POA: Diagnosis not present

## 2022-08-26 DIAGNOSIS — M48061 Spinal stenosis, lumbar region without neurogenic claudication: Secondary | ICD-10-CM | POA: Diagnosis not present

## 2022-08-26 DIAGNOSIS — M5136 Other intervertebral disc degeneration, lumbar region: Secondary | ICD-10-CM | POA: Diagnosis not present

## 2022-09-01 DIAGNOSIS — M5126 Other intervertebral disc displacement, lumbar region: Secondary | ICD-10-CM | POA: Diagnosis not present

## 2022-09-07 ENCOUNTER — Other Ambulatory Visit (HOSPITAL_COMMUNITY): Payer: Self-pay

## 2022-09-07 MED ORDER — OMEPRAZOLE 20 MG PO CPDR: 20.0000 mg | DELAYED_RELEASE_CAPSULE | Freq: Every day | ORAL | 0 refills | Status: AC

## 2022-10-04 ENCOUNTER — Other Ambulatory Visit (HOSPITAL_COMMUNITY): Payer: Self-pay

## 2022-10-04 DIAGNOSIS — L603 Nail dystrophy: Secondary | ICD-10-CM | POA: Diagnosis not present

## 2022-10-04 DIAGNOSIS — L239 Allergic contact dermatitis, unspecified cause: Secondary | ICD-10-CM | POA: Diagnosis not present

## 2022-10-04 DIAGNOSIS — M5126 Other intervertebral disc displacement, lumbar region: Secondary | ICD-10-CM | POA: Diagnosis not present

## 2022-10-04 MED ORDER — CLOBETASOL PROPIONATE 0.05 % EX LOTN
1.0000 | TOPICAL_LOTION | Freq: Two times a day (BID) | CUTANEOUS | 0 refills | Status: AC
  Filled 2022-10-04: qty 59, 30d supply, fill #0

## 2022-10-04 MED ORDER — CLOBETASOL PROPIONATE 0.05 % EX OINT
1.0000 | TOPICAL_OINTMENT | Freq: Two times a day (BID) | CUTANEOUS | 1 refills | Status: AC
  Filled 2022-10-04: qty 45, 23d supply, fill #0

## 2022-10-05 ENCOUNTER — Other Ambulatory Visit (HOSPITAL_COMMUNITY): Payer: Self-pay

## 2022-10-17 DIAGNOSIS — M5126 Other intervertebral disc displacement, lumbar region: Secondary | ICD-10-CM | POA: Diagnosis not present

## 2022-10-24 DIAGNOSIS — M5126 Other intervertebral disc displacement, lumbar region: Secondary | ICD-10-CM | POA: Diagnosis not present

## 2022-11-23 ENCOUNTER — Other Ambulatory Visit (HOSPITAL_COMMUNITY): Payer: Self-pay

## 2022-11-23 DIAGNOSIS — U071 COVID-19: Secondary | ICD-10-CM | POA: Diagnosis not present

## 2022-11-23 DIAGNOSIS — J069 Acute upper respiratory infection, unspecified: Secondary | ICD-10-CM | POA: Diagnosis not present

## 2022-11-23 MED ORDER — PAXLOVID (300/100) 20 X 150 MG & 10 X 100MG PO TBPK
3.0000 | ORAL_TABLET | Freq: Two times a day (BID) | ORAL | 0 refills | Status: AC
  Filled 2022-11-23: qty 30, 5d supply, fill #0

## 2023-04-11 DIAGNOSIS — R350 Frequency of micturition: Secondary | ICD-10-CM | POA: Diagnosis not present

## 2023-04-11 DIAGNOSIS — N926 Irregular menstruation, unspecified: Secondary | ICD-10-CM | POA: Diagnosis not present

## 2023-04-11 DIAGNOSIS — Z01419 Encounter for gynecological examination (general) (routine) without abnormal findings: Secondary | ICD-10-CM | POA: Diagnosis not present

## 2023-04-11 DIAGNOSIS — N898 Other specified noninflammatory disorders of vagina: Secondary | ICD-10-CM | POA: Diagnosis not present

## 2023-04-27 DIAGNOSIS — R635 Abnormal weight gain: Secondary | ICD-10-CM | POA: Diagnosis not present

## 2023-04-27 DIAGNOSIS — R6882 Decreased libido: Secondary | ICD-10-CM | POA: Diagnosis not present

## 2023-04-27 DIAGNOSIS — R5383 Other fatigue: Secondary | ICD-10-CM | POA: Diagnosis not present

## 2023-04-27 DIAGNOSIS — R002 Palpitations: Secondary | ICD-10-CM | POA: Diagnosis not present

## 2023-04-27 DIAGNOSIS — E282 Polycystic ovarian syndrome: Secondary | ICD-10-CM | POA: Diagnosis not present

## 2023-04-27 DIAGNOSIS — Z13220 Encounter for screening for lipoid disorders: Secondary | ICD-10-CM | POA: Diagnosis not present

## 2023-04-27 DIAGNOSIS — G4700 Insomnia, unspecified: Secondary | ICD-10-CM | POA: Diagnosis not present

## 2023-04-27 DIAGNOSIS — R110 Nausea: Secondary | ICD-10-CM | POA: Diagnosis not present

## 2023-04-27 DIAGNOSIS — M791 Myalgia, unspecified site: Secondary | ICD-10-CM | POA: Diagnosis not present

## 2023-04-27 DIAGNOSIS — M2550 Pain in unspecified joint: Secondary | ICD-10-CM | POA: Diagnosis not present

## 2023-06-21 DIAGNOSIS — R5383 Other fatigue: Secondary | ICD-10-CM | POA: Diagnosis not present

## 2023-06-21 DIAGNOSIS — Z79890 Hormone replacement therapy (postmenopausal): Secondary | ICD-10-CM | POA: Diagnosis not present

## 2023-06-21 DIAGNOSIS — R6882 Decreased libido: Secondary | ICD-10-CM | POA: Diagnosis not present

## 2023-06-21 DIAGNOSIS — N951 Menopausal and female climacteric states: Secondary | ICD-10-CM | POA: Diagnosis not present

## 2023-06-22 ENCOUNTER — Other Ambulatory Visit (HOSPITAL_COMMUNITY): Payer: Self-pay

## 2023-06-22 MED ORDER — CLOBETASOL PROPIONATE 0.05 % EX LOTN
1.0000 | TOPICAL_LOTION | Freq: Two times a day (BID) | CUTANEOUS | 0 refills | Status: AC
  Filled 2023-06-22: qty 59, 30d supply, fill #0

## 2023-08-30 DIAGNOSIS — Z79890 Hormone replacement therapy (postmenopausal): Secondary | ICD-10-CM | POA: Diagnosis not present

## 2023-08-30 DIAGNOSIS — N915 Oligomenorrhea, unspecified: Secondary | ICD-10-CM | POA: Diagnosis not present

## 2023-08-30 DIAGNOSIS — E282 Polycystic ovarian syndrome: Secondary | ICD-10-CM | POA: Diagnosis not present

## 2023-08-30 DIAGNOSIS — N951 Menopausal and female climacteric states: Secondary | ICD-10-CM | POA: Diagnosis not present

## 2023-08-30 DIAGNOSIS — R5383 Other fatigue: Secondary | ICD-10-CM | POA: Diagnosis not present

## 2023-08-30 DIAGNOSIS — M2550 Pain in unspecified joint: Secondary | ICD-10-CM | POA: Diagnosis not present

## 2023-08-30 DIAGNOSIS — R6882 Decreased libido: Secondary | ICD-10-CM | POA: Diagnosis not present

## 2023-11-11 ENCOUNTER — Other Ambulatory Visit (HOSPITAL_COMMUNITY): Payer: Self-pay

## 2023-11-11 MED ORDER — ONDANSETRON HCL 8 MG PO TABS
8.0000 mg | ORAL_TABLET | Freq: Two times a day (BID) | ORAL | 1 refills | Status: AC | PRN
  Filled 2023-11-11: qty 30, 15d supply, fill #0

## 2023-11-11 MED ORDER — PROGESTERONE MICRONIZED 100 MG PO CAPS
100.0000 mg | ORAL_CAPSULE | Freq: Every day | ORAL | 5 refills | Status: AC
  Filled 2023-11-11: qty 30, 30d supply, fill #0
  Filled 2023-12-11: qty 30, 30d supply, fill #1
  Filled 2024-01-08 (×2): qty 30, 30d supply, fill #2
  Filled 2024-02-20: qty 30, 30d supply, fill #3

## 2023-11-13 ENCOUNTER — Other Ambulatory Visit (HOSPITAL_COMMUNITY): Payer: Self-pay

## 2024-01-03 DIAGNOSIS — N946 Dysmenorrhea, unspecified: Secondary | ICD-10-CM | POA: Diagnosis not present

## 2024-01-03 DIAGNOSIS — M791 Myalgia, unspecified site: Secondary | ICD-10-CM | POA: Diagnosis not present

## 2024-01-03 DIAGNOSIS — R5383 Other fatigue: Secondary | ICD-10-CM | POA: Diagnosis not present

## 2024-01-08 ENCOUNTER — Other Ambulatory Visit (HOSPITAL_COMMUNITY): Payer: Self-pay

## 2024-01-08 ENCOUNTER — Other Ambulatory Visit: Payer: Self-pay
